# Patient Record
Sex: Female | Born: 1965 | Race: Asian | Hispanic: No | Smoking: Never smoker
Health system: Western US, Academic
[De-identification: ages and names within clinical notes are randomized; demographics above are authoritative.]

## PROBLEM LIST (undated history)

## (undated) DIAGNOSIS — N838 Other noninflammatory disorders of ovary, fallopian tube and broad ligament: Secondary | ICD-10-CM

## (undated) DIAGNOSIS — D649 Anemia, unspecified: Secondary | ICD-10-CM

## (undated) HISTORY — DX: Other noninflammatory disorders of ovary, fallopian tube and broad ligament: N83.8

## (undated) MED ORDER — ACETAMINOPHEN 325 MG PO TABS
650.00 mg | ORAL_TABLET | Freq: Four times a day (QID) | ORAL | 0 refills | Status: AC | PRN
Start: 2021-03-28 — End: ?

## (undated) MED ORDER — LIDOCAINE 5 % EX PTCH
1.00 | MEDICATED_PATCH | CUTANEOUS | 0 refills | Status: AC
Start: 2021-03-28 — End: ?

## (undated) MED ORDER — IBUPROFEN 400 MG OR TABS
400.00 mg | ORAL_TABLET | Freq: Three times a day (TID) | ORAL | 0 refills | Status: AC | PRN
Start: 2021-03-28 — End: ?

---

## 2020-05-03 IMAGING — MR RM COLUNA CERVICAL DORSAL E LOMBAR
7 of 22 series · 13 of 48 positions shown · non-contrast
Comparison: none

[Series 301: T2 · sagittal · 4.0mm · 0.56mm/px · 2 of 16 slices shown (1 of 7)]
[im 1/16]
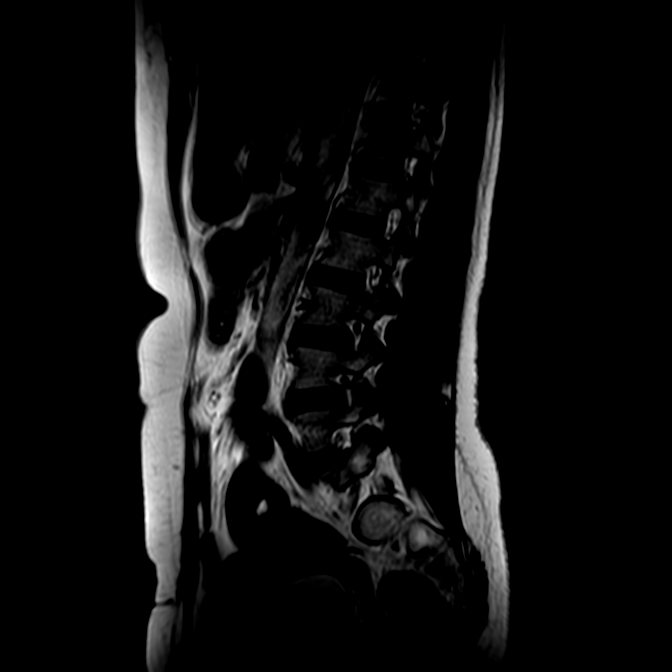
[im 16/16]
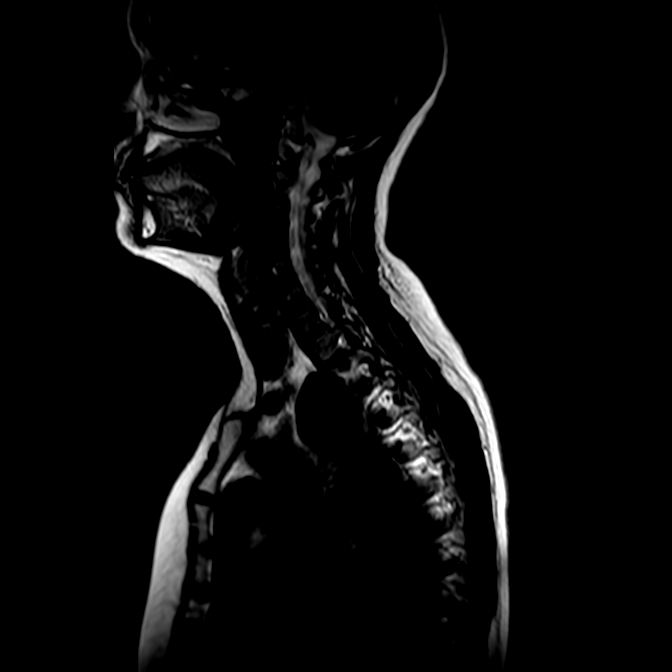

[Series 302: T2 · sagittal · 4.0mm · 0.56mm/px · 2 of 8 slices shown (2 of 7)]
[im 1/8]
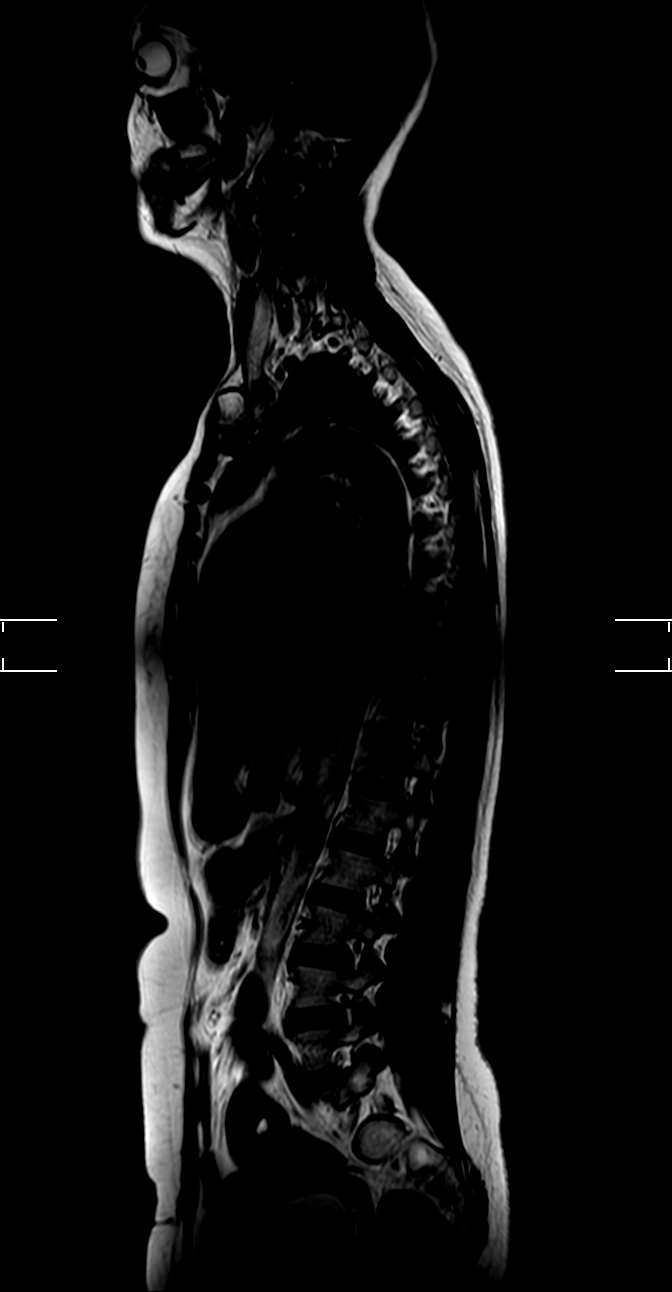
[im 8/8]
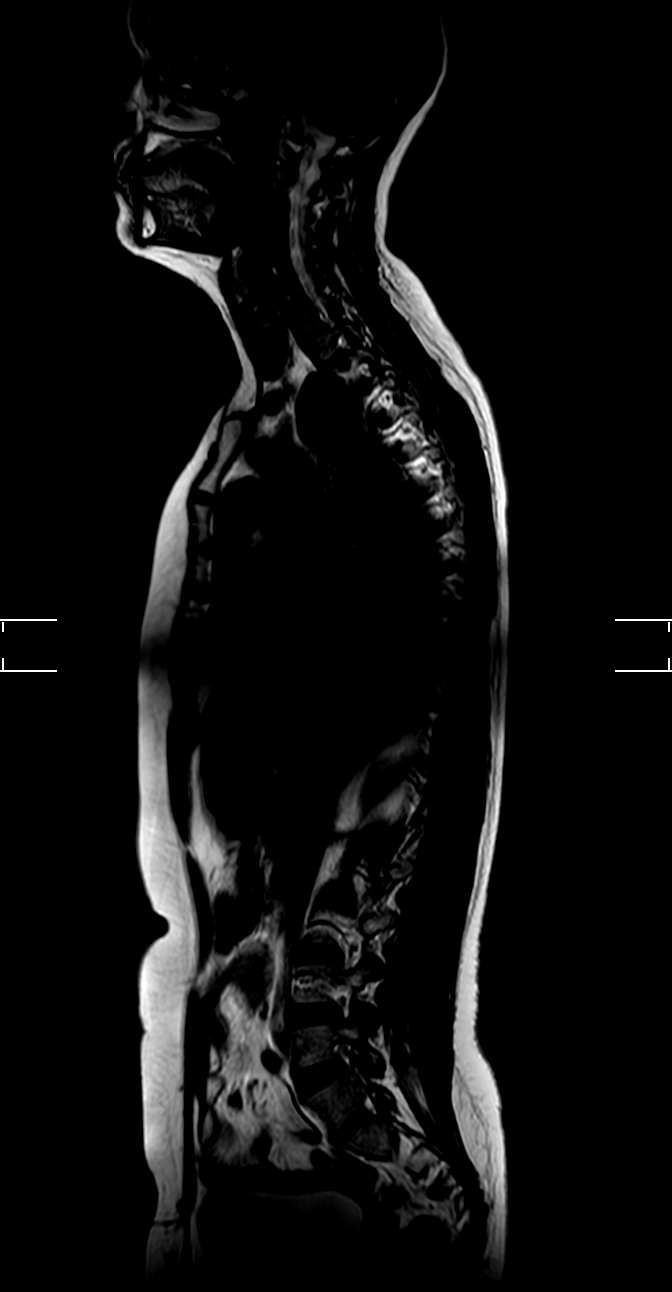

[Series 401: T2 · sagittal · 3.5mm · 0.34mm/px · 2 of 24 slices shown (3 of 7)]
[im 1/24]
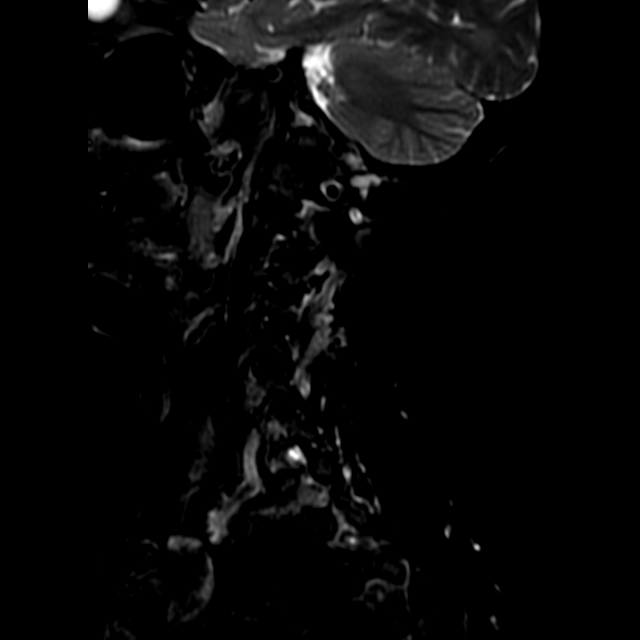
[im 24/24]
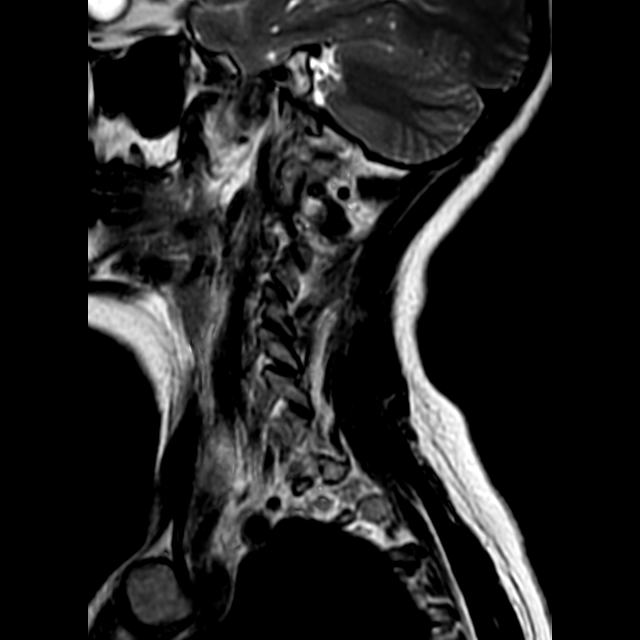

[Series 404: T2 · sagittal · 3.5mm · 0.34mm/px · 1 of 12 slices shown (4 of 7)]
[im 1/12]
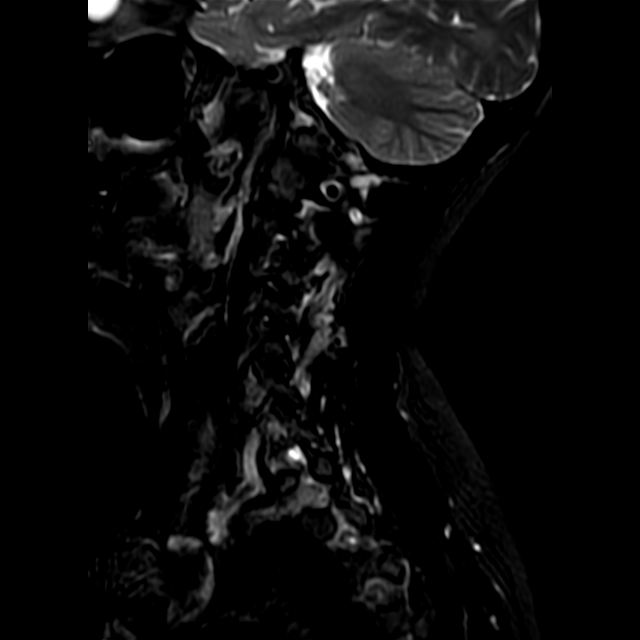

[Series 405: T2 · sagittal · 3.5mm · 0.34mm/px · 1 of 12 slices shown (5 of 7)]
[im 1/12]
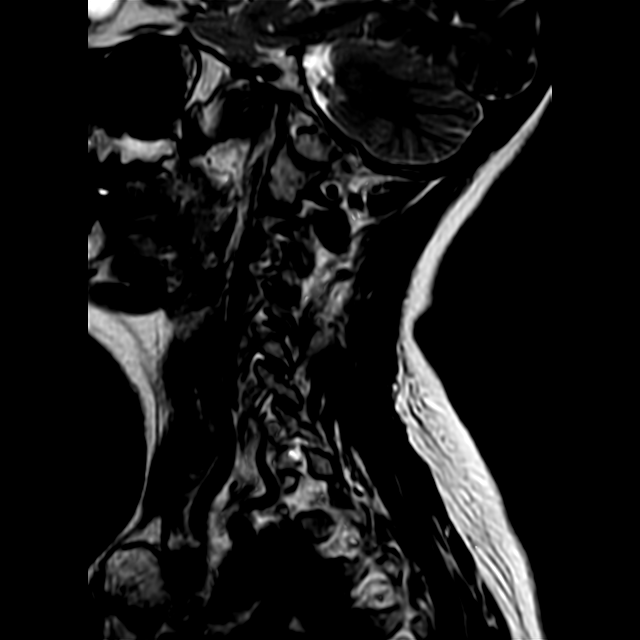

[Series 702: T2 · axial · 4.0mm · 0.30mm/px · z∈[-57,+62]mm · 2 of 25 slices shown (6 of 7)]
[im 1/25]
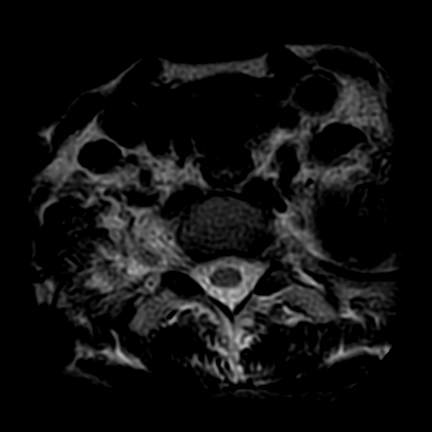
[im 25/25]
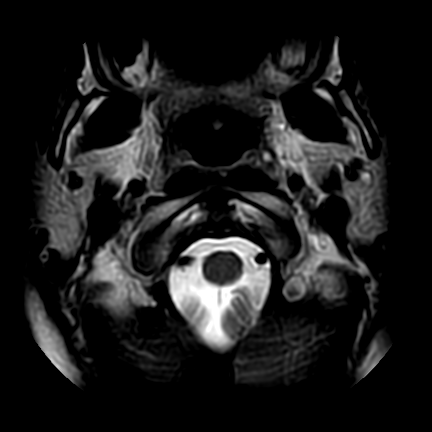

[Series 801: T2 · sagittal · 3.5mm · 0.86mm/px · 3 of 32 slices shown (7 of 7)]
[im 1/32]
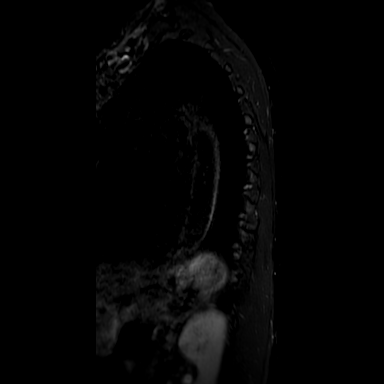
[im 16/32]
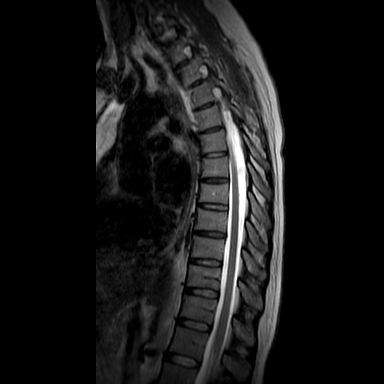
[im 32/32]
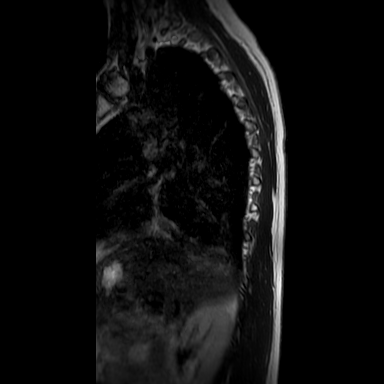

[13 of 48 positions shown; findings below may reference images not displayed]

------------- REPORT [id number] -------------
CERVICAL SPINE MAGNETIC RESONANCE IMAGING
TECHNIQUE
Exam performed on high-field equipment with images obtained in T1, T2, and STIR weighted sequences, without the injection of venous contrast.
FINDINGS
Slight accentuation of physiological cervical lordosis.
Aligned vertebral bodies, with preserved heights, showing anterior marginal osteophytes.
Pedicles without alterations by the method.
Uncovertebral and interapophyseal joints without significant changes.
Dehydration of intervertebral discs, with preserved heights.
Neural foramina and spinal canal with preserved amplitudes.
Spinal cord with normal thickness and signal intensity.
Musculoadipose planes preserved.

------------- REPORT [id number] -------------
MAGNETIC RESONANCE IMAGING OF THE DORSAL SPINE
TECHNIQUE
Exam performed on high-field equipment with image acquisition in T1, T2, and STIR weighted sequences, without the injection of venous contrast.
FINDINGS
Mild accentuation of the physiological dorsal kyphosis.
Aligned vertebral bodies, with preserved heights, showing anterior marginal osteophytes.
Pedicles without alterations to the method.
Costovertebral and interapophyseal joints without significant changes.
Dehydration of the intervertebral discs, with preserved heights.
Neural foramina and spinal canal with preserved amplitudes.
Spinal cord with normal thickness and signal intensity.
Musculoadipose planes preserved.

------------- REPORT [id number] -------------
MAGNETIC RESONANCE IMAGING OF THE LUMBAR SPINE
TECHNIQUE
Exam performed on a high-field equipment with image acquisition in T1, T2, and STIR weighted sequences, without the injection of venous contrast.
FINDINGS
Aligned vertebral bodies, with preserved heights, showing tiny marginal osteophytes.
Pedicles without alterations to the method.
Facet joints without significant changes.
Dehydration of the intervertebral discs, with preserved heights.
Left foraminal disc protrusions at L2-L3 and L3-L4, reducing the amplitude of the respective neuroforamina, maintaining proximity to the emerging nerve root.
Other neural foramina and spinal canal with preserved amplitudes.
Spinal cord with normal thickness and signal intensity.
Musculoadipose planes preserved.

## 2020-05-09 IMAGING — MR RM PE ESQUERDO
7 of 15 series · 18 of 40 positions shown · non-contrast
Comparison: none

[T1 · coronal · 3.0mm · 0.29mm/px · 3 of 48 slices shown (1 of 3)]
[im 1/48]
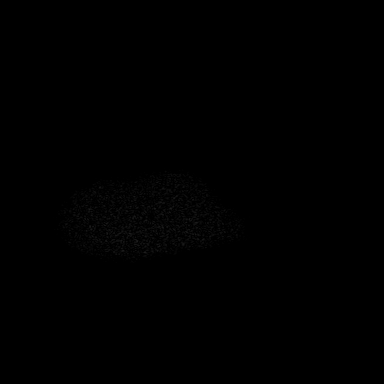
[im 24/48]
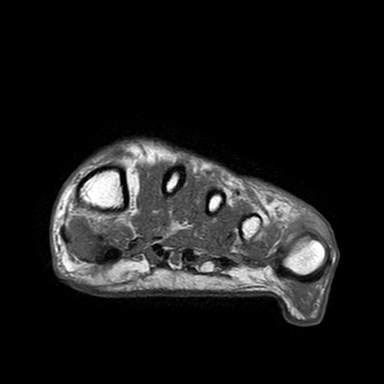
[im 48/48]
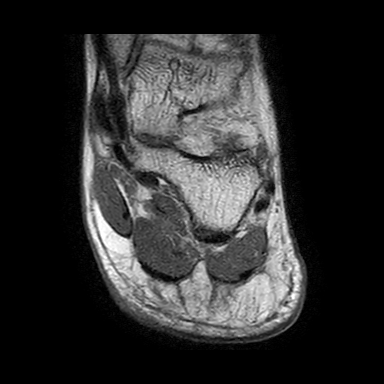

[T1 · coronal · 3.0mm · 0.29mm/px · 3 of 48 slices shown (2 of 3)]
[im 1/48]
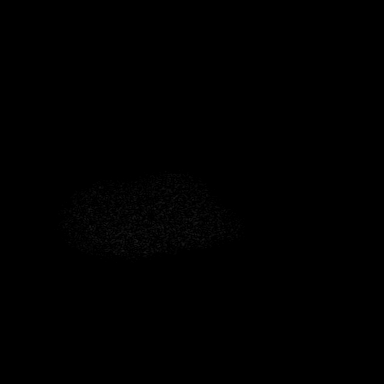
[im 24/48]
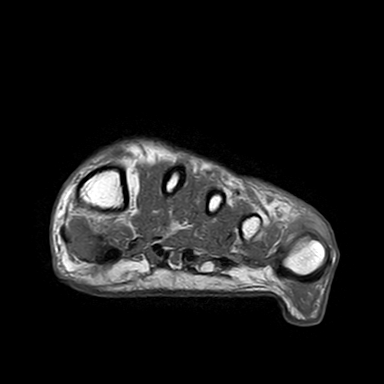
[im 48/48]
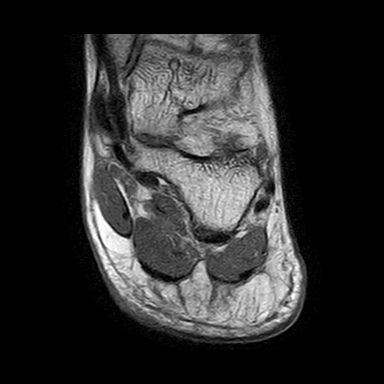

[T1 · sagittal · 3.0mm · 0.23mm/px · 1 of 25 slices shown (3 of 3)]
[im 1/25]
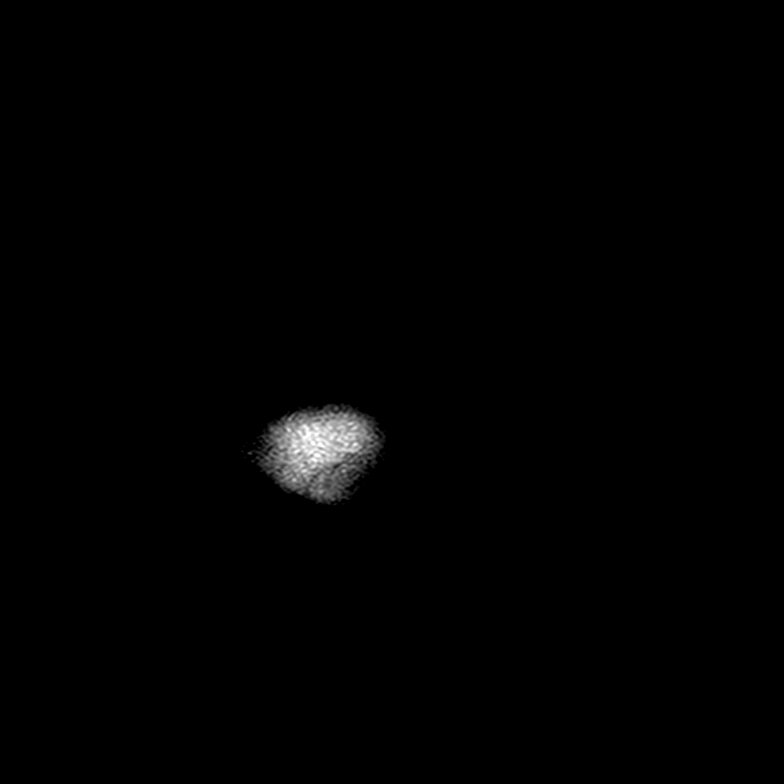

[T1 post-contrast · axial · 3.0mm · 0.24mm/px · z∈[-55,+25]mm · 3 of 48 slices shown (1 of 2)]
[im 1/48]
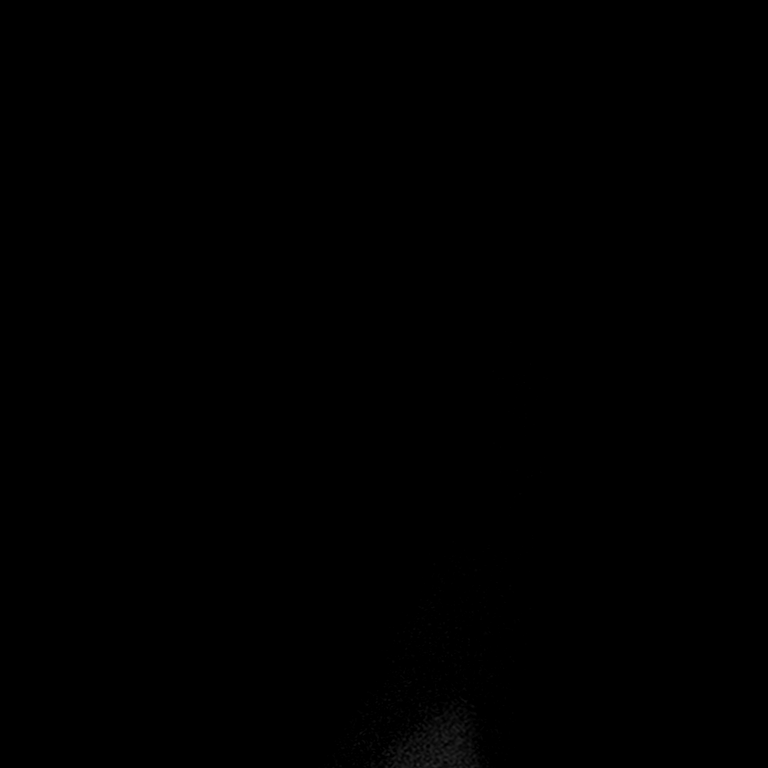
[im 24/48]
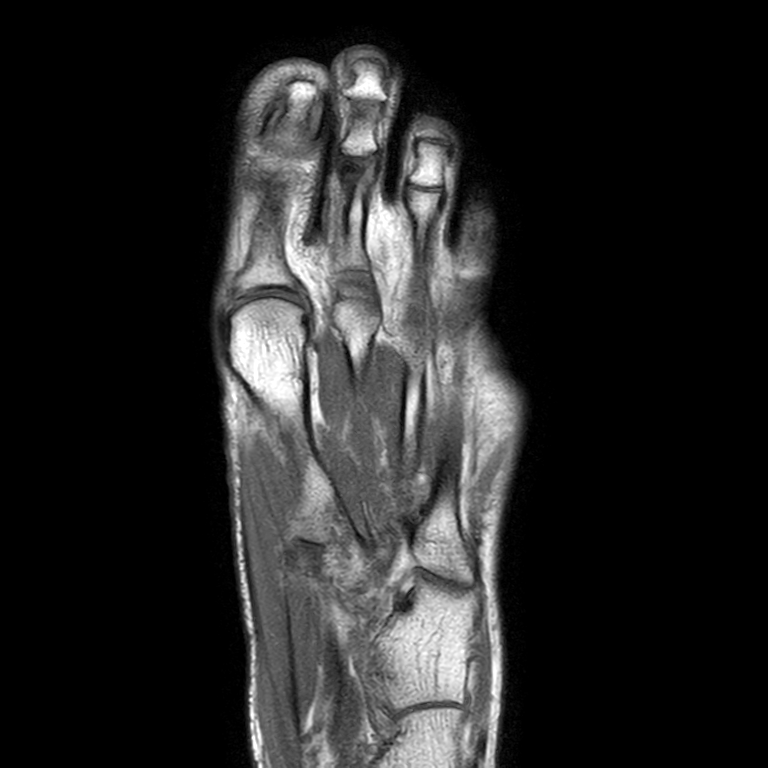
[im 48/48]
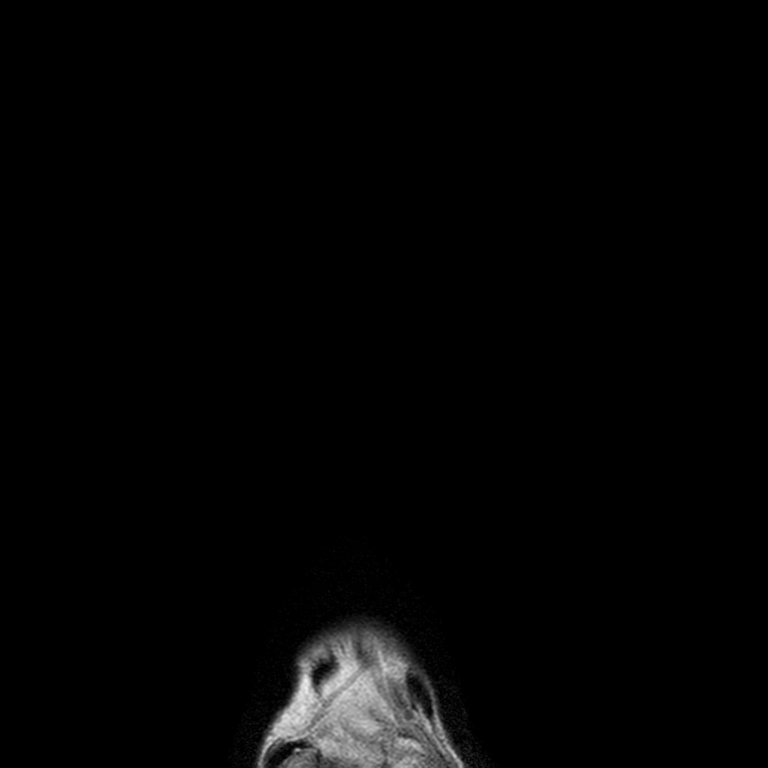

[T1 fat-sat post-contrast · axial · 3.0mm · 0.24mm/px · 1 of 24 slices shown (1 of 2)]
[im 1/24]
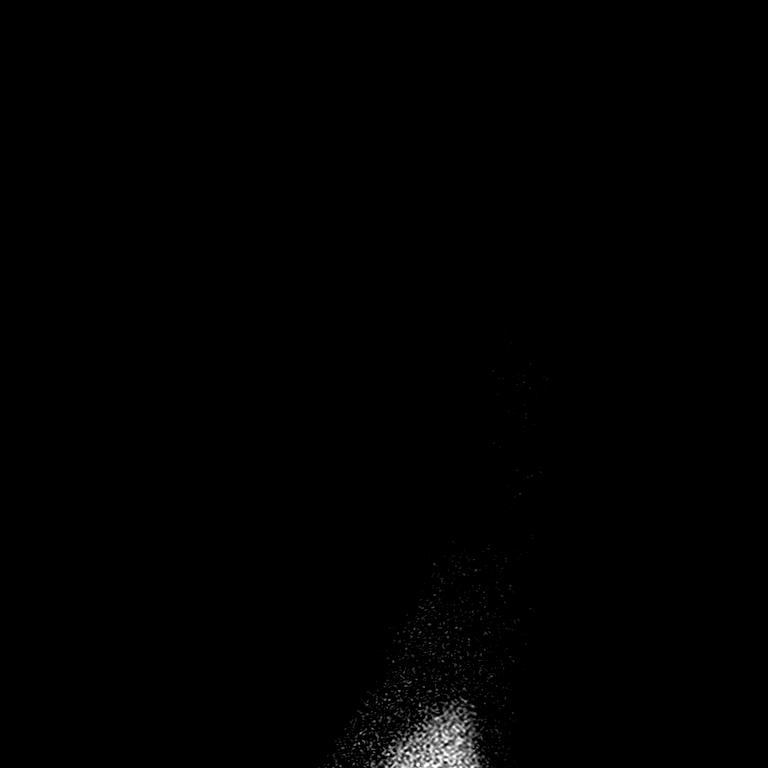

[T1 post-contrast · coronal · 3.0mm · 0.26mm/px · 5 of 96 slices shown (2 of 2)]
[im 1/96]
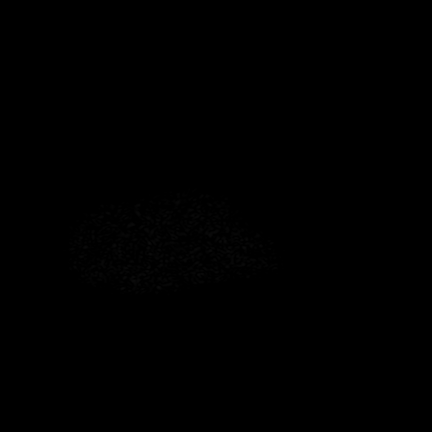
[im 24/96]
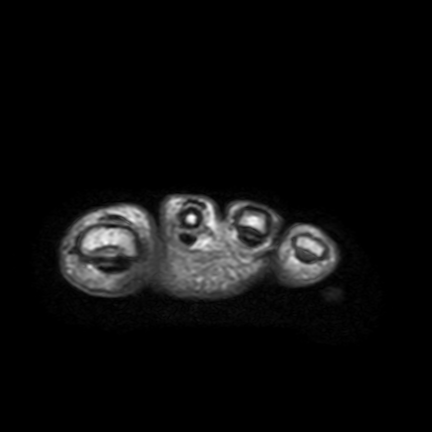
[im 48/96]
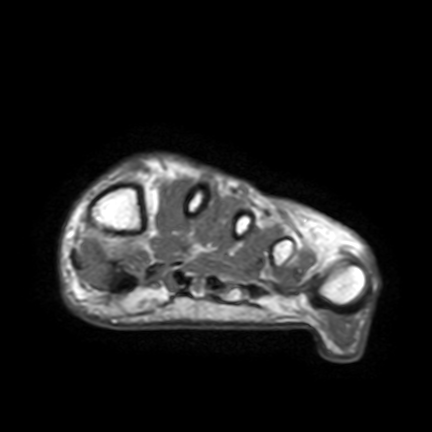
[im 72/96]
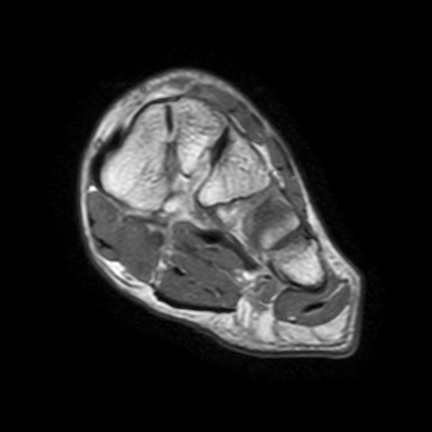
[im 96/96]
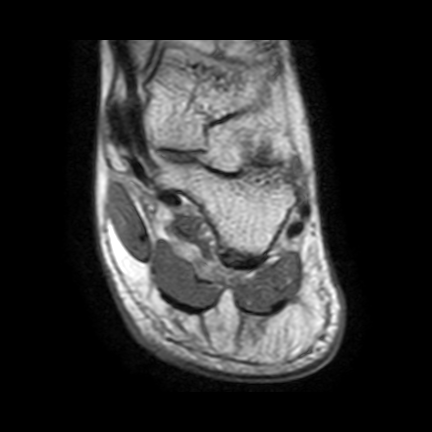

[T1 fat-sat post-contrast · coronal · 3.0mm · 0.26mm/px · 2 of 48 slices shown (2 of 2)]
[im 1/48]
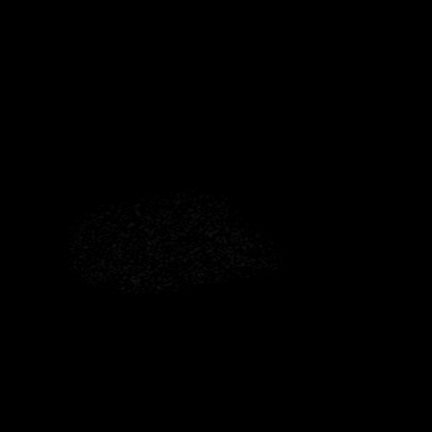
[im 24/48]
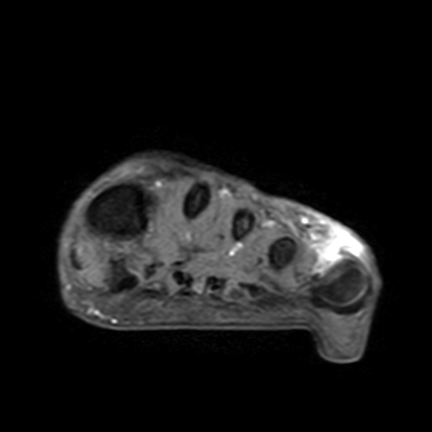

[18 of 40 positions shown; findings below may reference images not displayed]

TÉCNICA DE EXAME:
Realizadas sequências multiplanares ponderadas em T1 e densidade protônica com supressão de gordura, 
antes e após a administração do contraste venoso.
ANÁLISE:
Obliteração / edema do coxim gorduroso plantar adjacente à falange proximal / cabeça do metatarsal 
RESSONÂNCIA MAGNÉTICA DO PÉ DIREITO/ESQUERDO
do  quinto  dedo,  inferindo  sobrecarga  mecânica,  devendo-se  considerar  no  diagnóstico  diferencial  a 
possibilidade de bursite adventícia / subcapital.
Rotura do complexo capsuloligamentar medial da metatarsofalângica do quinto dedo , notando-se edema das 
partes moles circunjacentes , bem como realce após a administração do meio de contraste paramagnético.
Estruturas ósseas com morfologia e intensidade de sinal preservados.
Não há sinais de derrame articular significativo.
Ligamento de Lisfranc íntegro.
Não há sinais de desinserção das placas plantares.
Estruturas musculotendíneas com morfologia e intensidade de sinal normais.
Formação  nodular  na  face  plantar  entre  as  cabeças  do  terceiro  e  quarto  metatarsos,  medindo  0,7  cm, 
compatível com neuroma interdigital (Morton). Associam-se sinais de bursite intermetatársica neste espaço.

## 2020-05-19 IMAGING — MR RM PE DIREITO
6 of 15 series · 16 of 40 positions shown · non-contrast
Comparison: none

[Series 501: T1 · coronal · 3.5mm · 0.29mm/px · 3 of 40 slices shown (1 of 3)]
[im 1/40]
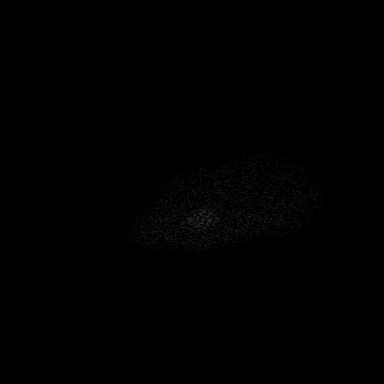
[im 20/40]
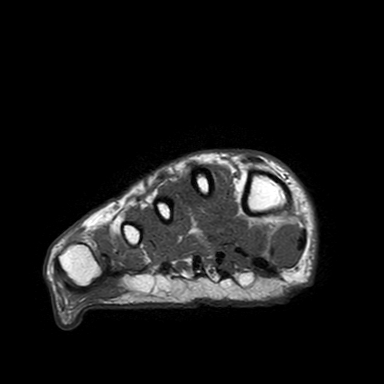
[im 40/40]
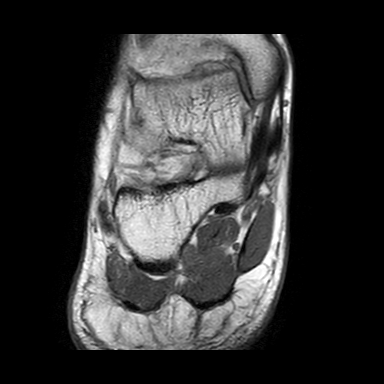

[Series 502: T1 · coronal · 3.5mm · 0.29mm/px · 3 of 40 slices shown (2 of 3)]
[im 1/40]
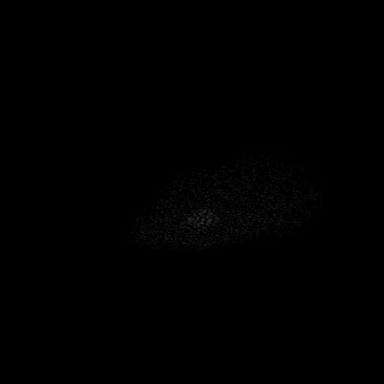
[im 20/40]
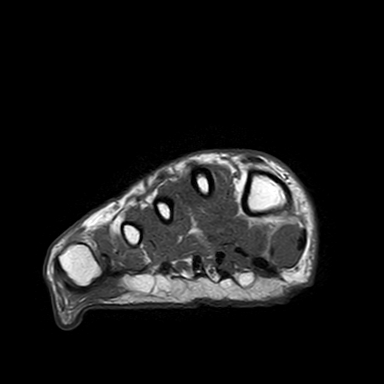
[im 40/40]
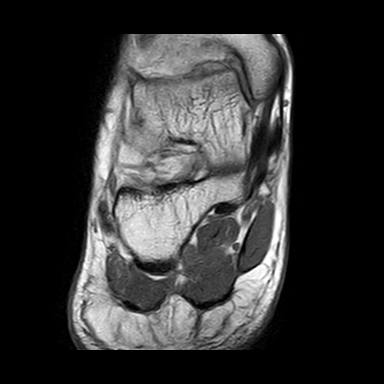

[Series 701: T1 · sagittal · 3.0mm · 0.23mm/px · 2 of 25 slices shown (3 of 3)]
[im 1/25]
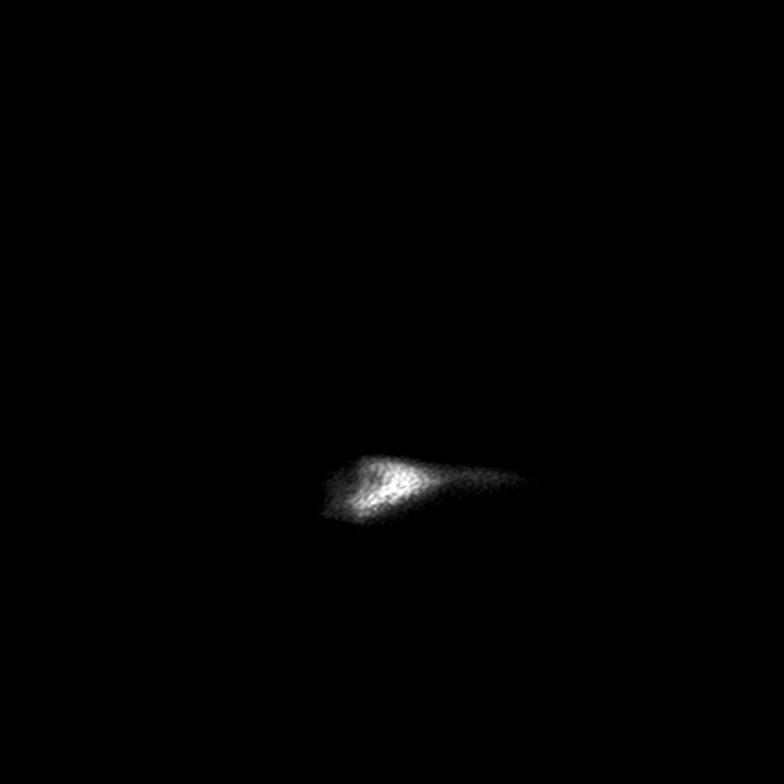
[im 25/25]
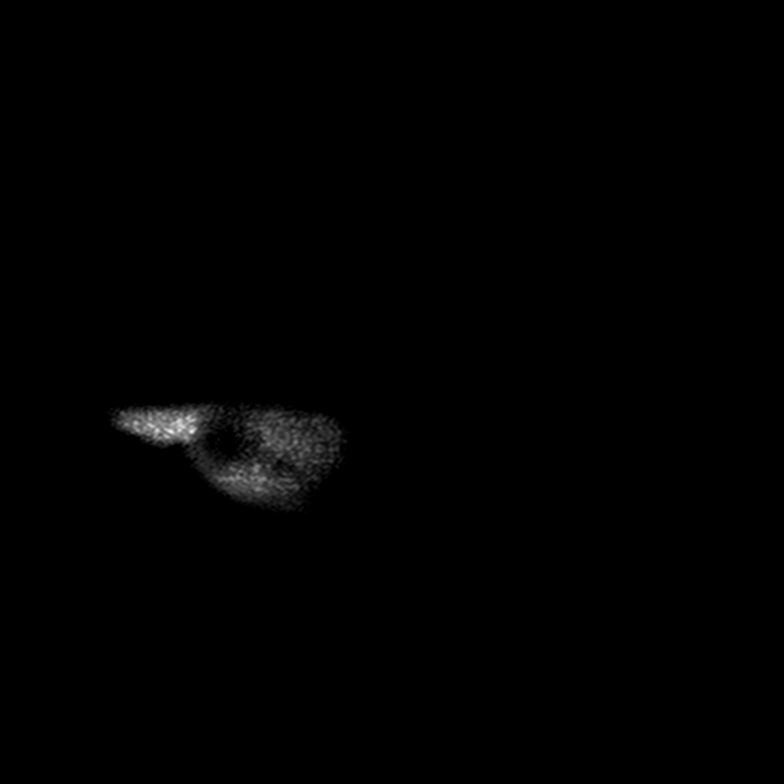

[Series 801: T1 post-contrast · axial · 3.0mm · 0.24mm/px · z∈[-67,-1]mm · 3 of 40 slices shown (1 of 2)]
[im 1/40]
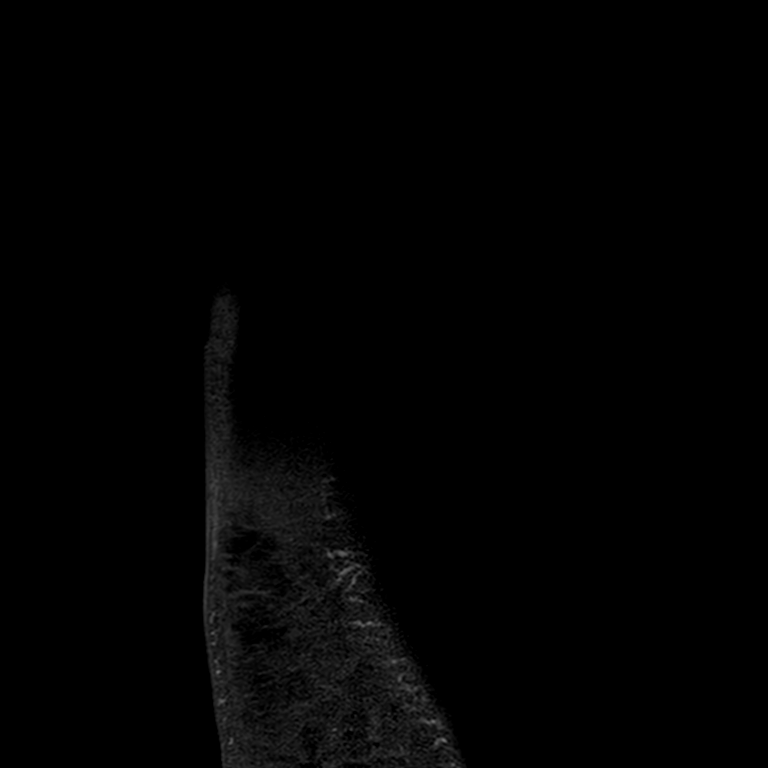
[im 20/40]
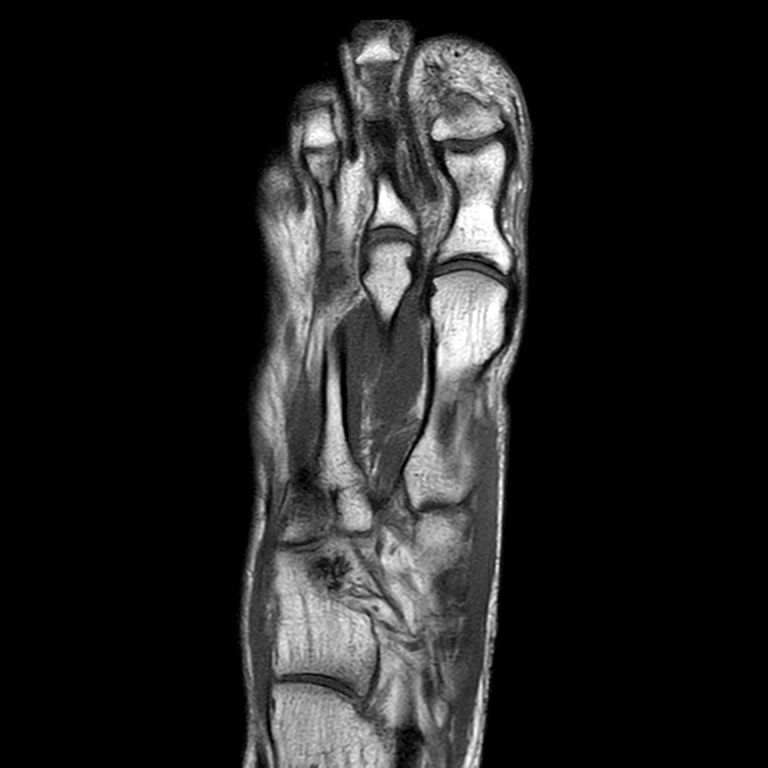
[im 40/40]
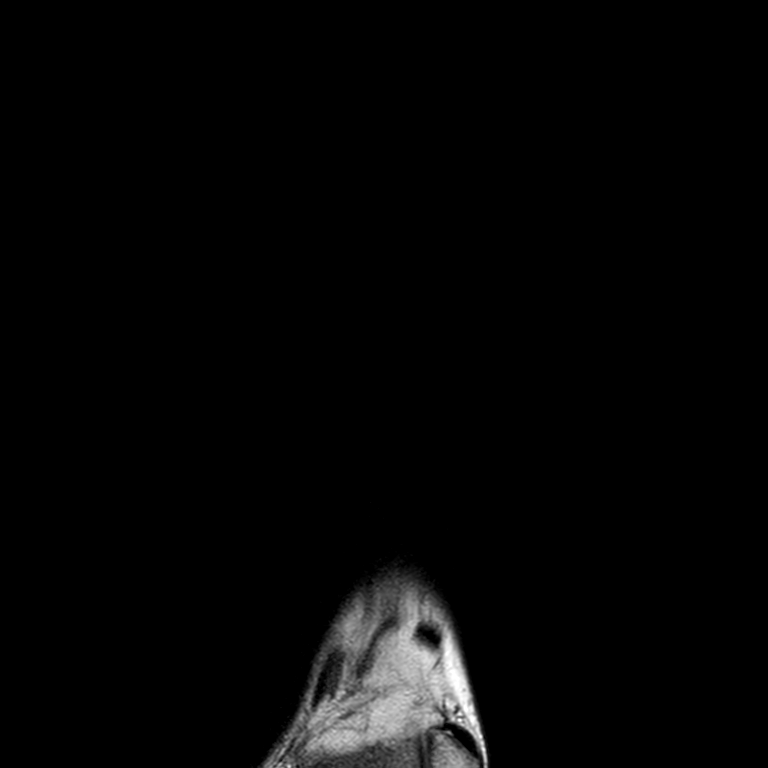

[Series 802: T1 fat-sat post-contrast · axial · 3.0mm · 0.24mm/px · 1 of 20 slices shown]
[im 1/20]
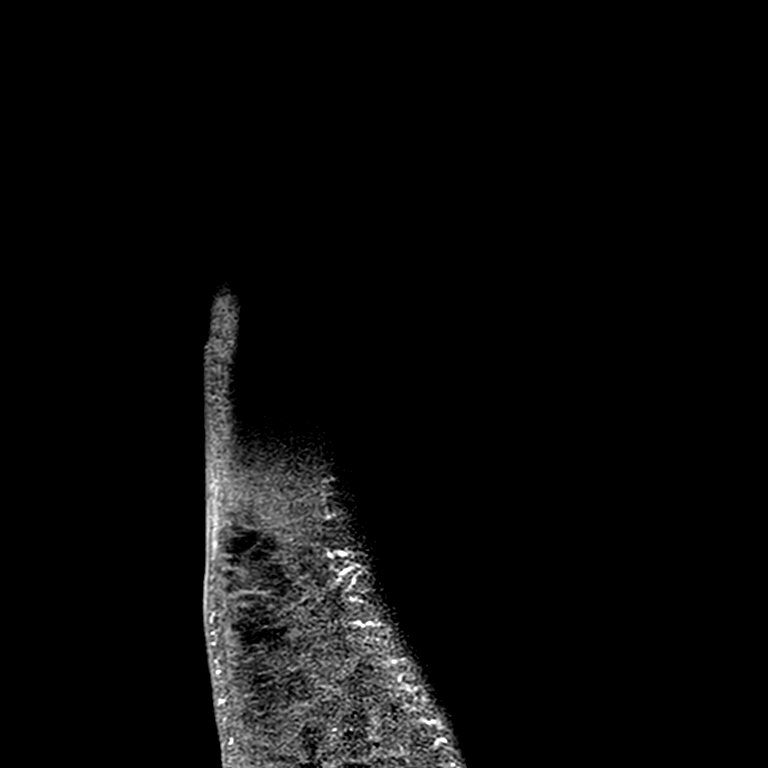

[Series 901: T1 post-contrast · coronal · 3.5mm · 0.26mm/px · 4 of 80 slices shown (2 of 2)]
[im 1/80]
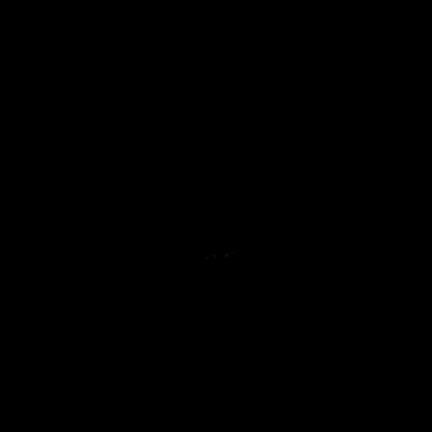
[im 20/80]
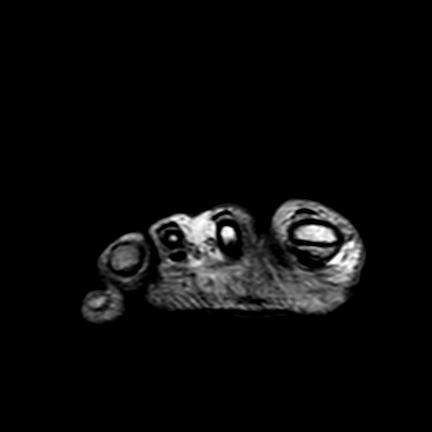
[im 40/80]
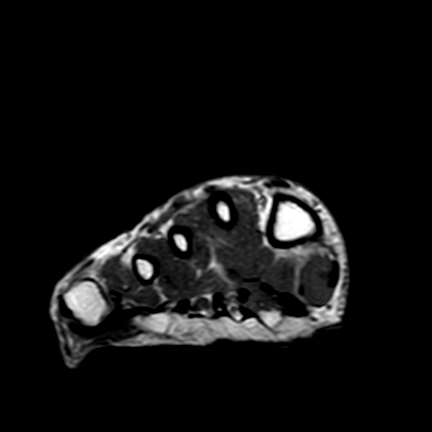
[im 60/80]
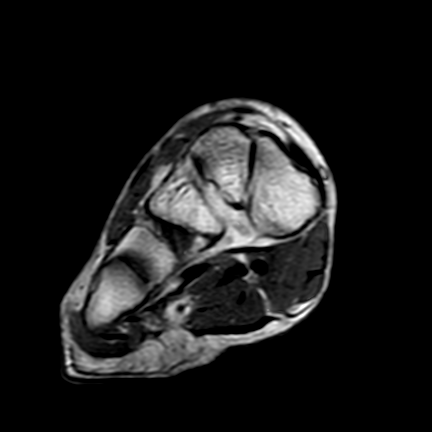

[16 of 40 positions shown; findings below may reference images not displayed]

RESSONÂNCIA MAGNÉTICA DO ANTEPÉ DIREITO
TÉCNICA
Exame realizado com sequências multiplanares pesadas em T1, T2 e DP, algumas com supressão de
gordura, com administração de contraste.
ACHADOS
Pequena fratura avulsiva na base do V metatarso, sem afastamento significativo do fragmento ósseo em
relação ao leito ósseo, notando-se intenso edema medular ósseo local, associado a espessamento e
alteração de sinal da inserção do tendão fibular curto, indicando tendinopatia, com edema reacional das
partes moles adjacentes.
Demais estruturas ósseas preservadas.
Superfícies condrais íntegras e regulares.
Estruturas capsuloligamentares preservadas.
Ausência de derrame articular significativo.
Ausência de neuromas e de bursopatias.
Demais planos miotendíneos, fasciais, adiposos e ligamentares e feixes neurovasculares sem alterações
significativas identificáveis ao método.
IMPRESSÃO
Pequena fratura avulsiva na base do V metatarso,  associado a espessamento e alteração de sinal da
inserção do tendão fibular curto, indicando tendinopatia, com edema reacional das partes moles
adjacentes.

## 2021-02-06 IMAGING — MR RM COL. CERVICAL DORSAL E LOMBAR
12 of 30 series · 16 of 48 positions shown · non-contrast
Comparison: none

------------- REPORT [id number] -------------
CERVICAL SPINE MAGNETIC RESONANCE IMAGING
TECHNIQUE: Examination performed using spin-echo technique with multiplanar acquisitions, obtaining T1 and T2 weighted sequences in multiplanar acquisitions.  
Analysis:  
Craniocervical transition with anatomical appearance.  
Normal alignment of vertebral bodies.  
Vertebral bodies with preserved height.  
Marginal osteophytes on vertebral bodies.  
Interfacet joints without significant alterations.  
Diffuse disc dehydration without significant reduction in their heights.  
No significant disc bulges or protrusions observed at the evaluated levels.  
Preserved amplitude of the spinal canal.  
Spinal cord with usual caliber and signal intensity.  
Preserved posterior paravertebral musculature.  
Diagnostic impression:  
Incipient degenerative spondylopathy.

[Series 301: T2 · sagittal · 3.5mm · 0.34mm/px · 1 of 24 slices shown (1 of 12)]
[im 1/24]
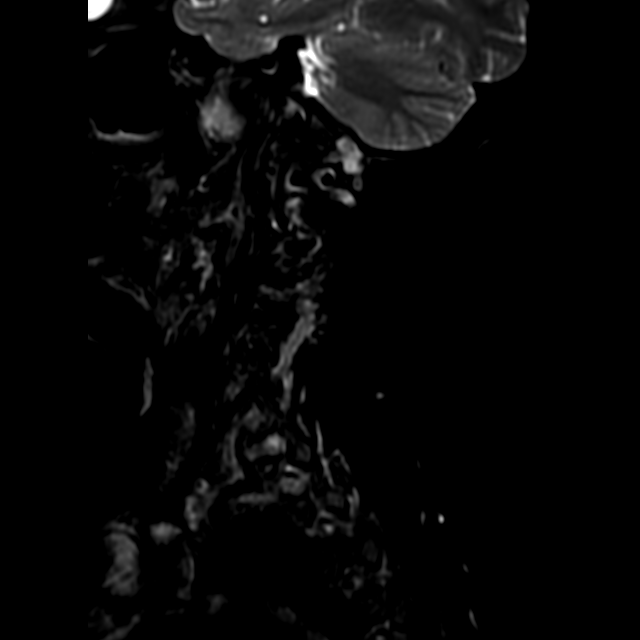

[Series 302: T2 · sagittal · 3.5mm · 0.34mm/px · 1 of 12 slices shown (2 of 12)]
[im 1/12]
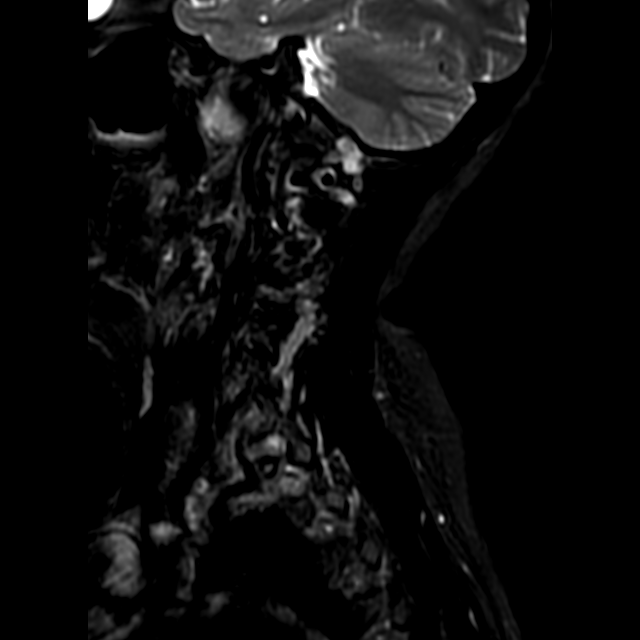

[Series 303: T2 · sagittal · 3.5mm · 0.34mm/px · 1 of 12 slices shown (3 of 12)]
[im 1/12]
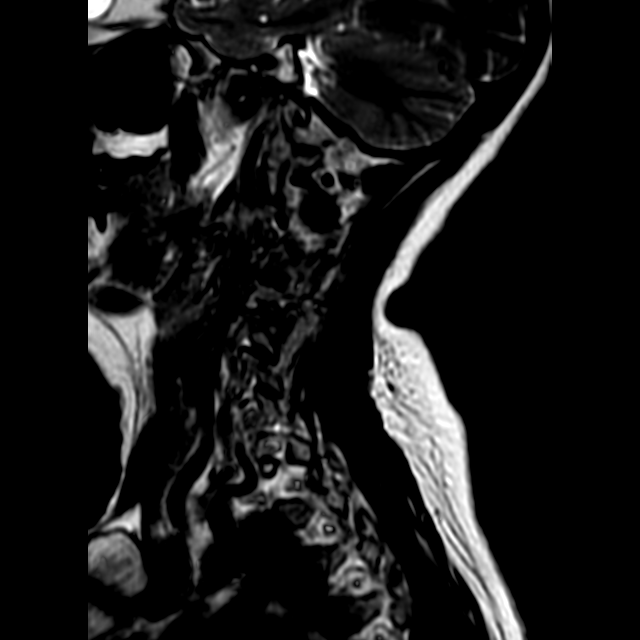

[Series 304: T2 · sagittal · 3.5mm · 0.34mm/px · 1 of 12 slices shown (4 of 12)]
[im 1/12]
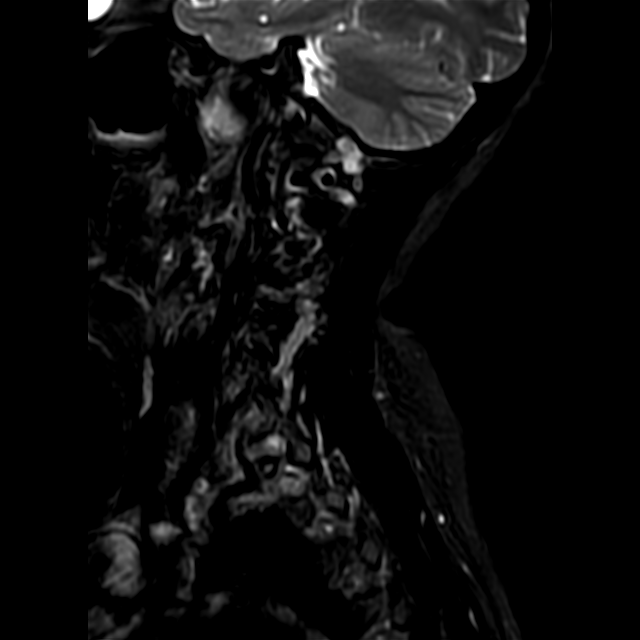

[Series 305: T2 · sagittal · 3.5mm · 0.34mm/px · 1 of 12 slices shown (5 of 12)]
[im 1/12]
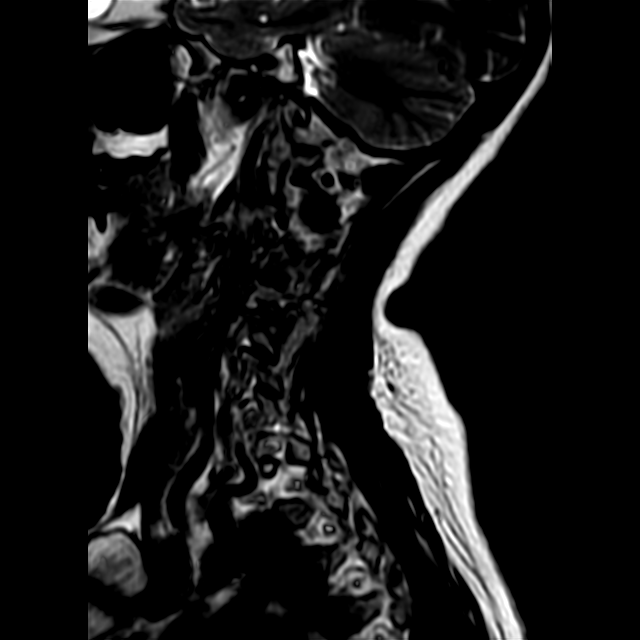

[Series 401: T2 · sagittal · 4.0mm · 0.54mm/px · 1 of 12 slices shown (6 of 12)]
[im 1/12]
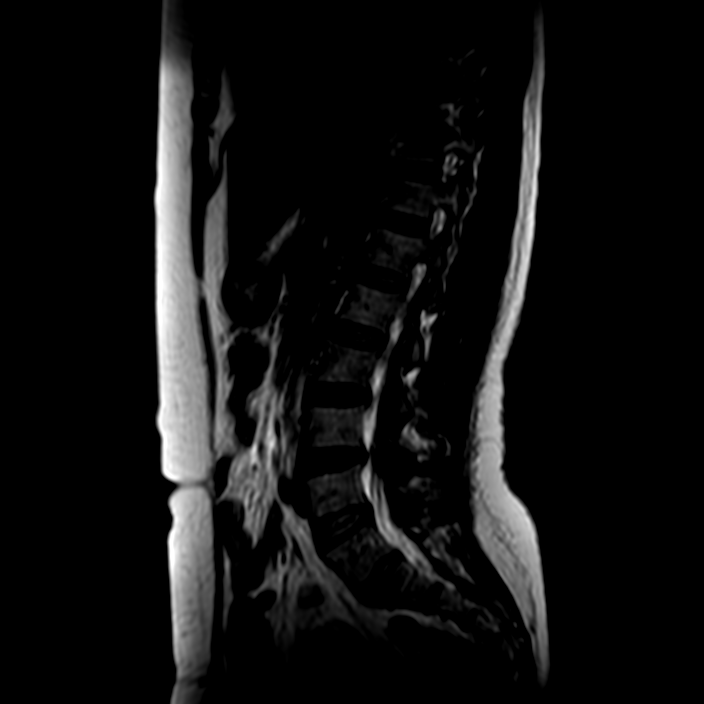

[Series 402: T2 · sagittal · 4.0mm · 0.54mm/px · 1 of 6 slices shown (7 of 12)]
[im 1/6]
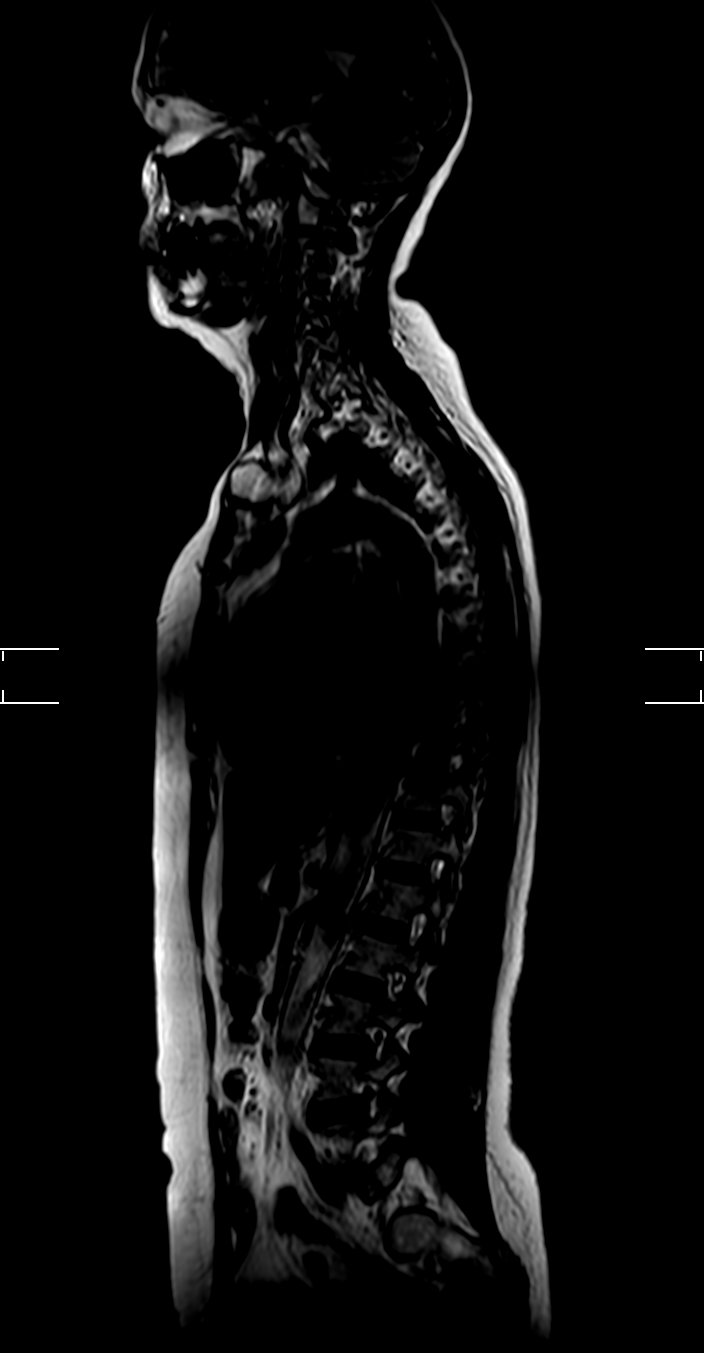

[Series 601: T2 · axial · 3.5mm · 0.30mm/px · 1 of 30 slices shown (8 of 12)]
[im 1/30]
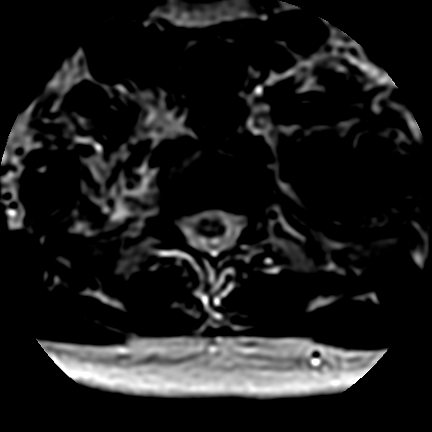

[Series 801: T2 · sagittal · 3.5mm · 0.85mm/px · 2 of 32 slices shown (9 of 12)]
[im 1/32]
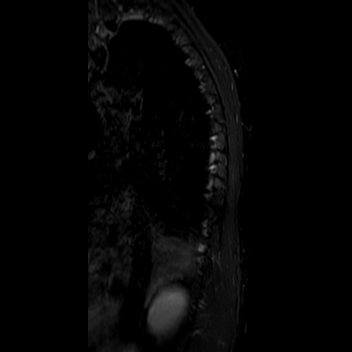
[im 32/32]
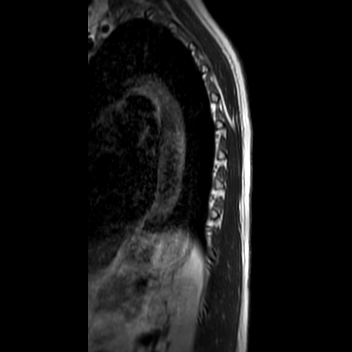

[Series 802: T2 · sagittal · 3.5mm · 0.85mm/px · 1 of 16 slices shown (10 of 12)]
[im 1/16]
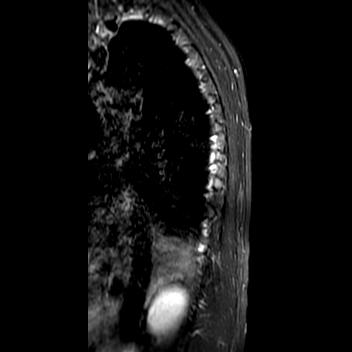

[Series 803: T2 · sagittal · 3.5mm · 0.85mm/px · 1 of 16 slices shown (11 of 12)]
[im 1/16]
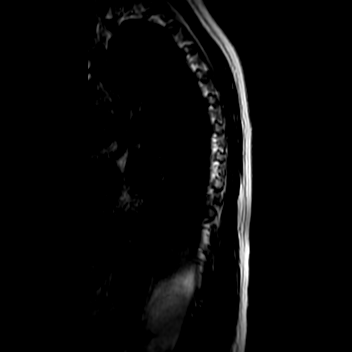

[Series 1001: T2 · axial · 4.0mm · 0.34mm/px · z∈[-305,-42]mm · 4 of 63 slices shown (12 of 12)]
[im 1/63]
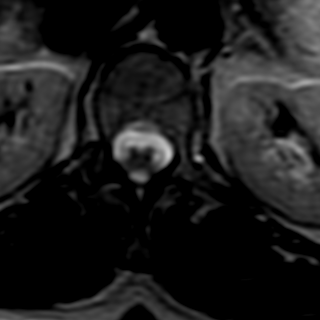
[im 21/63]
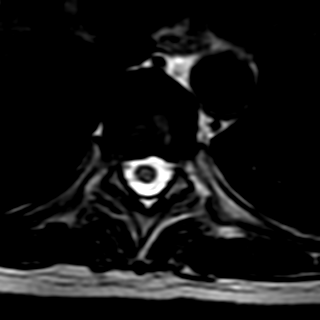
[im 42/63]
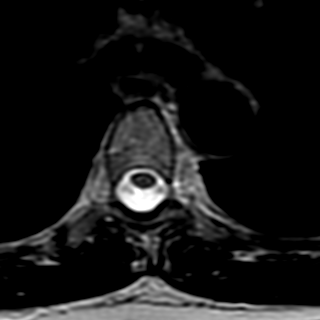
[im 63/63]
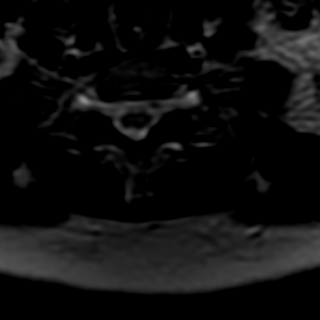

[16 of 48 positions shown; findings below may reference images not displayed]

------------- REPORT [id number] -------------
THORACIC SPINE MAGNETIC RESONANCE IMAGING
METHODOLOGY
T1 and T2 weighted sequences were obtained in multiplanar acquisitions.
ANALYSIS
Vertebral bodies aligned in the sagittal plane, with preserved height and bone marrow signal, without signs of fractures.
Intact pedicles and posterior arch structures.
Intervertebral discs with usual height and hydration, without significant bulges or protrusions.
Regular contours of the facet joints.
Normal diameters of the vertebral canal and neural foramina.
Spinal cord with preserved shape and signal.
Paravertebral musculature without alterations.
OPINION
Examination within normal standards.

------------- REPORT [id number] -------------
LUMBAR SPINE MAGNETIC RESONANCE IMAGING

Method:
Exam performed using the spin-echo technique with multiplanar acquisitions.
Analysis:
Vertebra with characteristics of lumbosacral transition, denominated VT. If there is clinical-surgical interest, evaluation through a total spine study can provide the exact count of vertebral bodies.
Normal alignment of vertebral bodies.
Preserved height of vertebral bodies.
Interfacet joints without significant alterations.
Discal dehydration at L3-L4, without significant reduction of intervertebral spaces.
Absence of significant disc herniations.
Medullary cone with preserved morphology, caliber, and signal.
Vertebral canal without significant stenoses.
Cauda equina roots with preserved morphology and distribution.
Preserved posterior paravertebral musculature.
Conclusion
Incipient and non-protruding degenerative discopathy at L3-L4.

## 2021-03-07 IMAGING — CR Imaging study
3 series · 3 of 3 positions shown · non-contrast
Comparison: none

[view not recorded (1 of 3)]
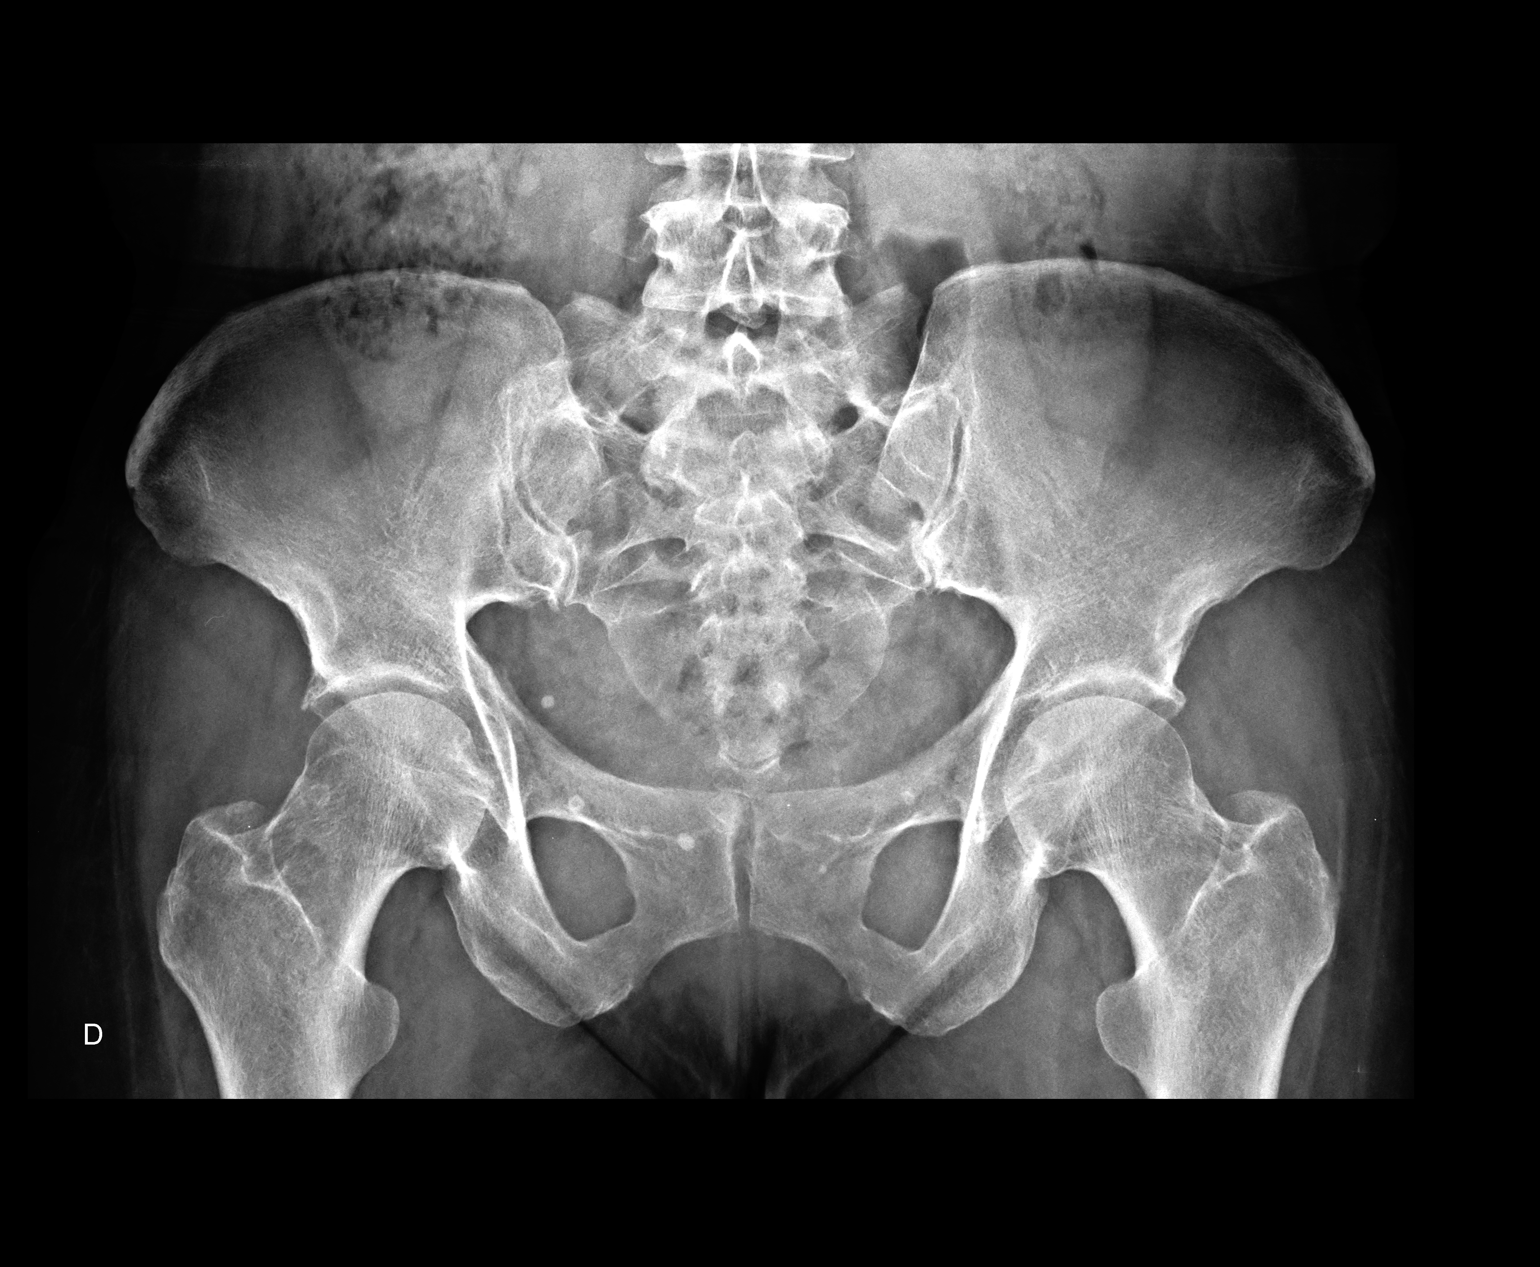

[view not recorded (2 of 3)]
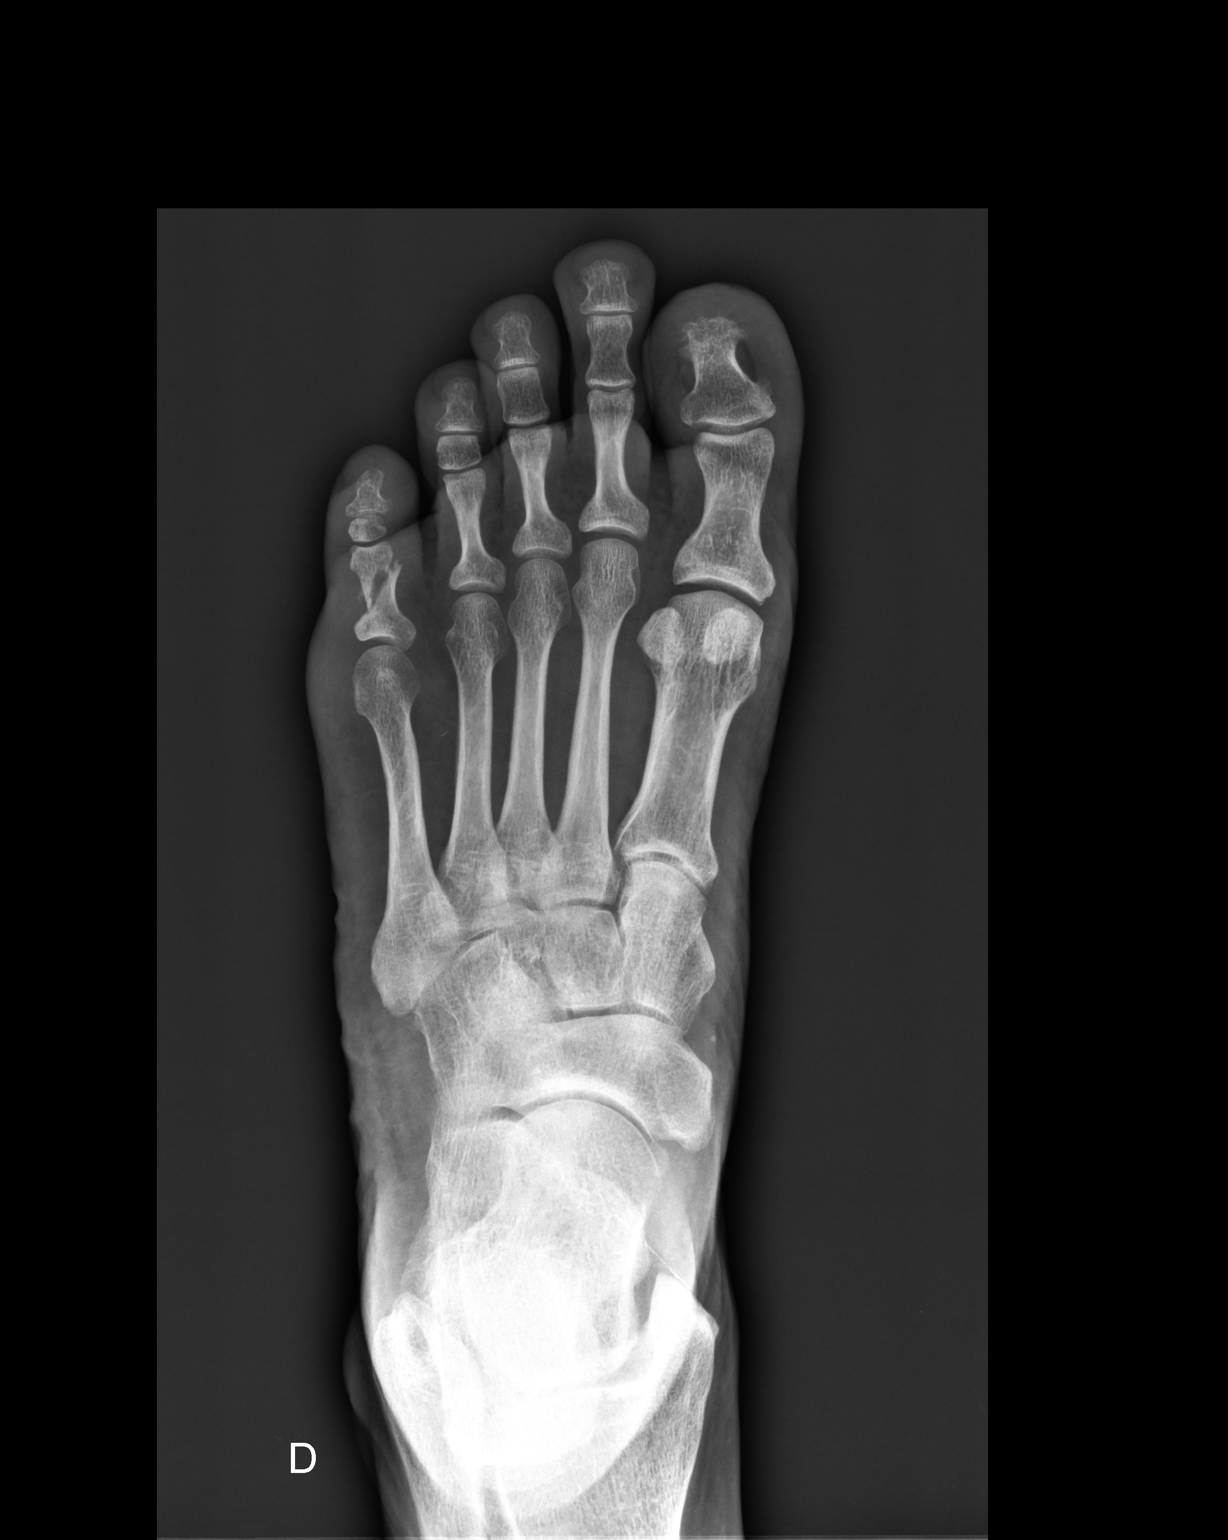

[view not recorded (3 of 3)]
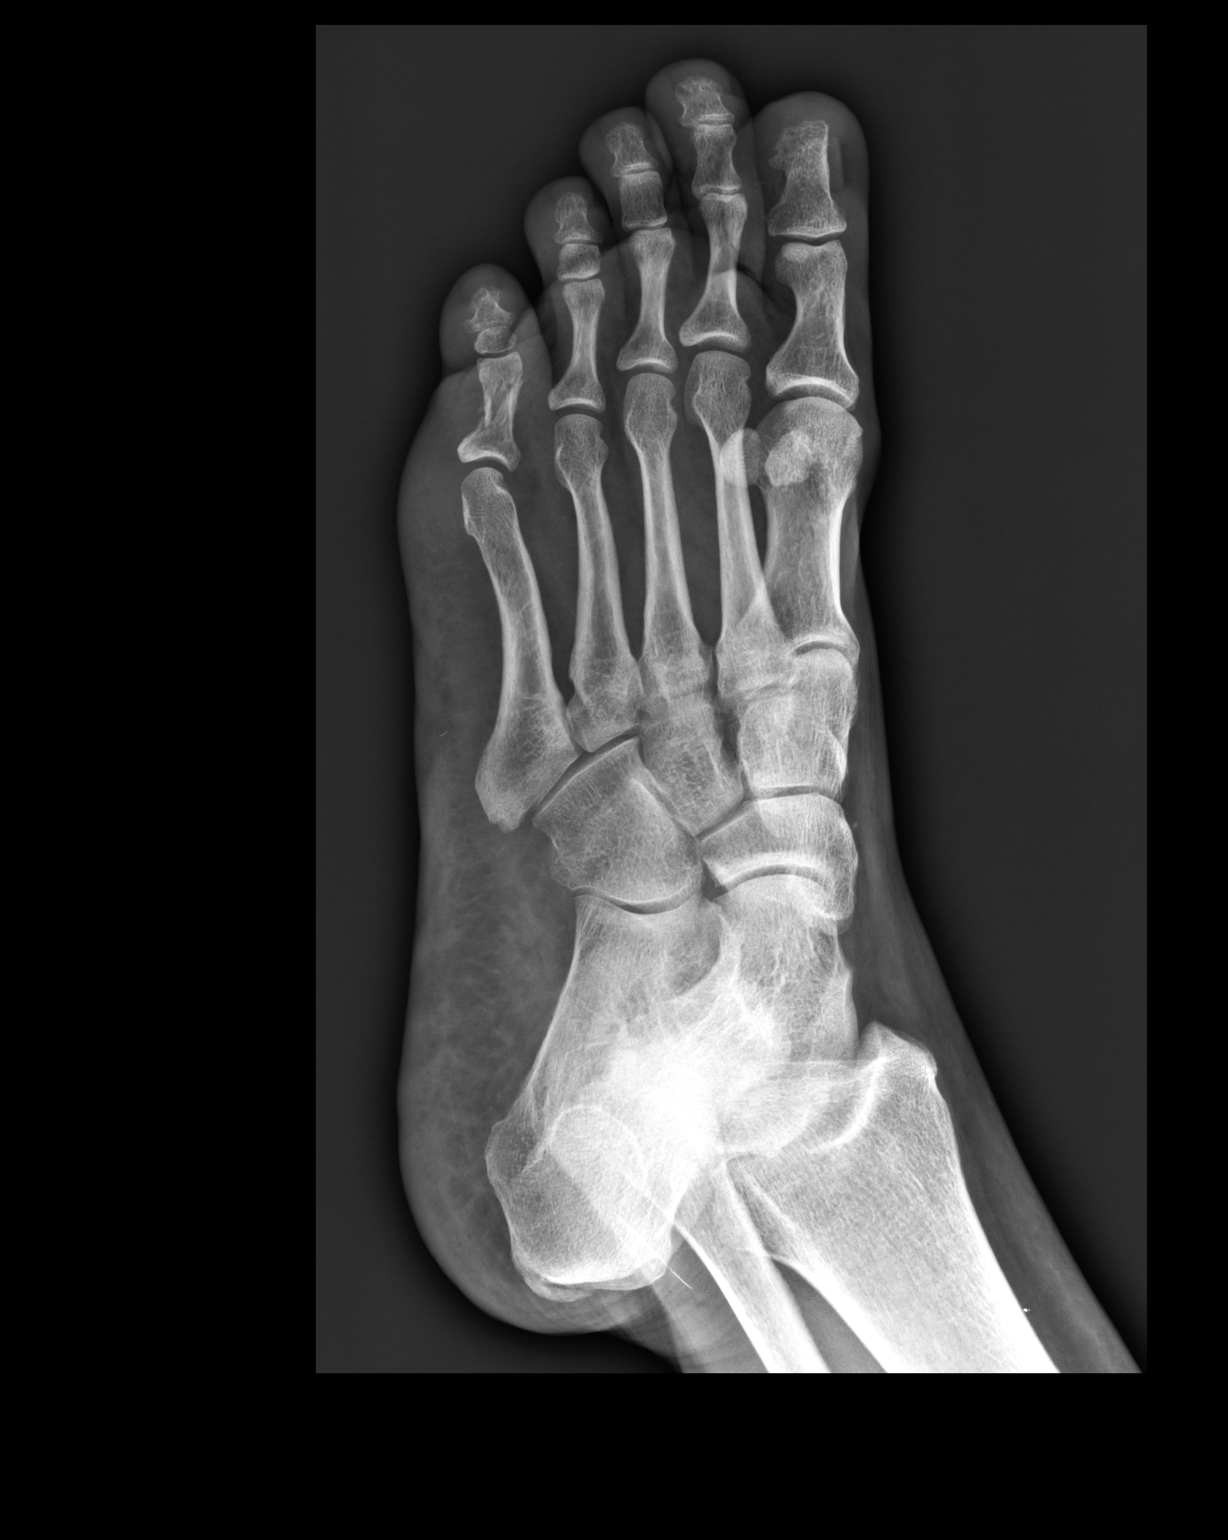

[3 of 3 positions shown; findings below may reference images not displayed]

------------- REPORT [id number] -------------
PELVIC X-RAY
Intact bone structure, no evidence of misaligned fractures.
Joint spaces and relationships maintained.
Tiny pelvic phleboliths.
No significant soft tissue changes noted on the exam.

------------- REPORT [id number] -------------
X-RAY OF THE RIGHT FOOT
Slightly misaligned fracture line in the proximal phalanx of the 5th toe.
Remaining bone structure intact, with no evidence of misaligned fractures.
Joint spaces and relationships maintained.
Soft tissues show no significant changes on the imaging.

## 2021-03-13 IMAGING — MR RM PELVE
15 of 21 series · 29 of 48 positions shown · non-contrast
Comparison: none

[survey ssh · axial · 7.0mm · 0.94mm/px · z∈[-60,+231]mm · 2 of 22 slices shown]
[im 1/22]
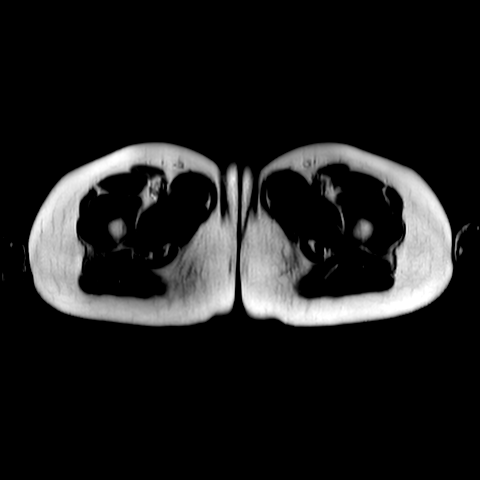
[im 22/22]
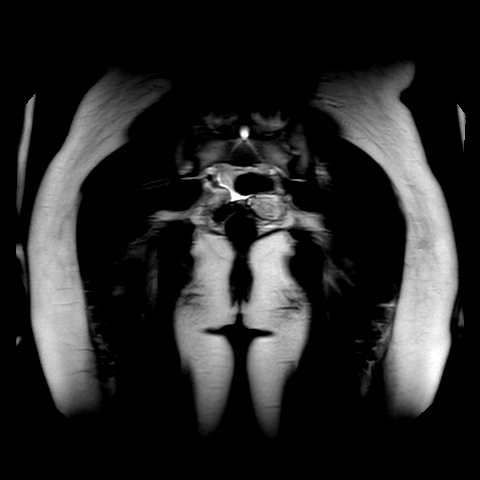

[T2 · coronal · 4.0mm · 0.31mm/px · 1 of 36 slices shown (1 of 5)]
[im 1/36]
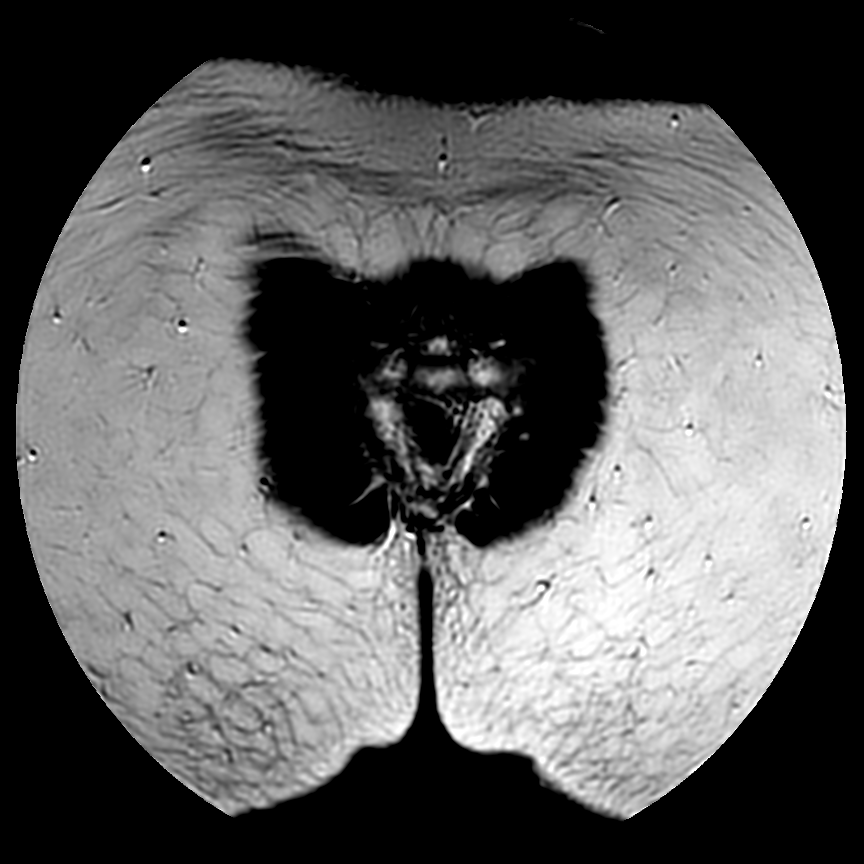

[T2 · coronal · 4.0mm · 0.31mm/px · 1 of 36 slices shown (2 of 5)]
[im 1/36]
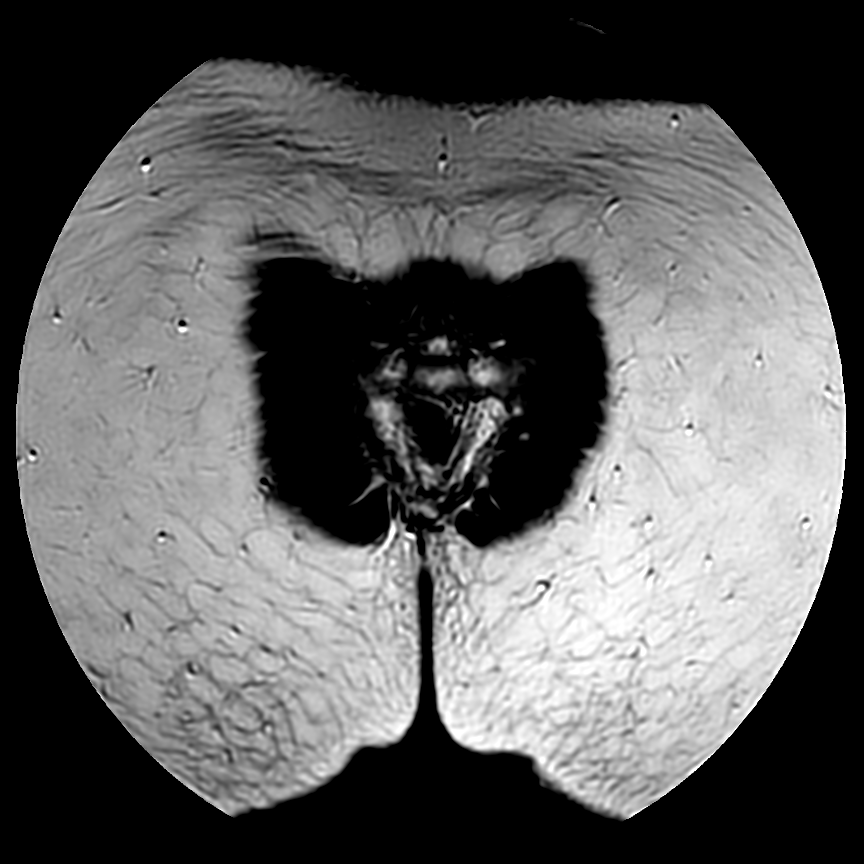

[T2 · sagittal · 4.0mm · 0.31mm/px · 1 of 30 slices shown (3 of 5)]
[im 1/30]
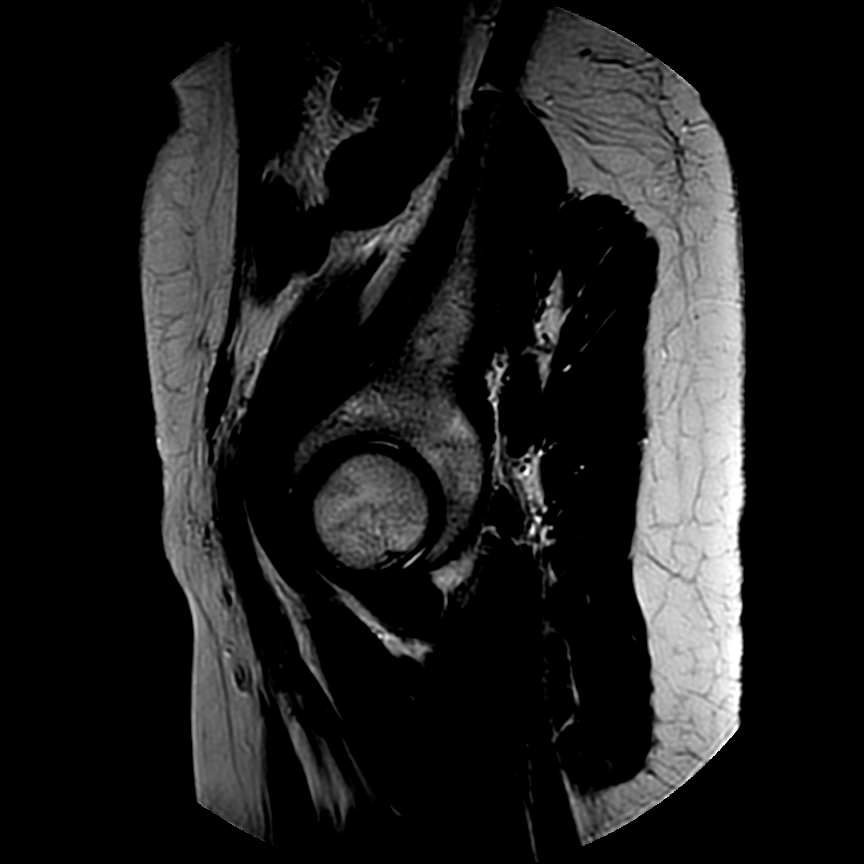

[T2 · sagittal · 4.0mm · 0.31mm/px · 1 of 30 slices shown (4 of 5)]
[im 1/30]
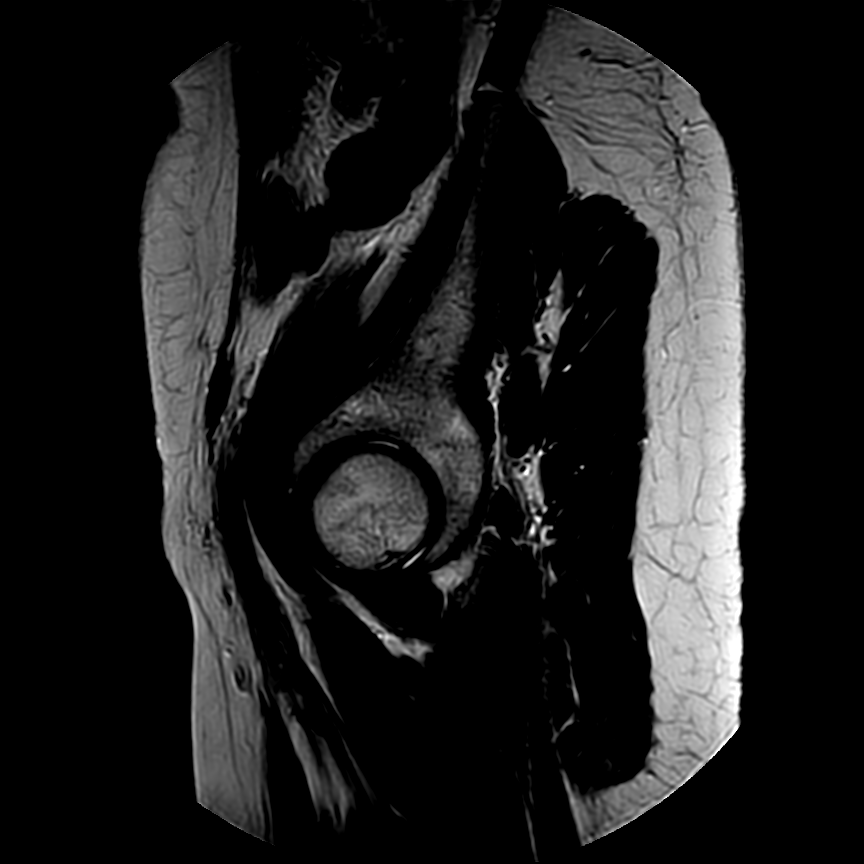

[T2 · axial · 4.0mm · 1.05mm/px · 1 of 40 slices shown (5 of 5)]
[im 1/40]
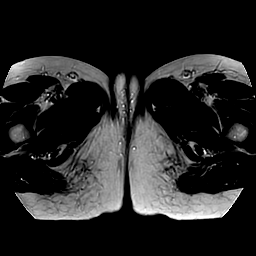

[axi difusao b(100-800) · axial · 4.0mm · 1.56mm/px · 1 of 40 slices shown (1 of 2)]
[im 1/40]
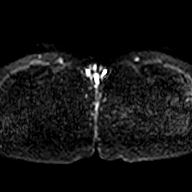

[axi difusao b(100-800) · axial · 4.0mm · 1.56mm/px · 1 of 40 slices shown (2 of 2)]
[im 1/40]
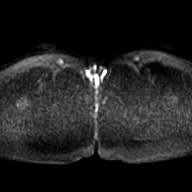

[saxi difusao b100 · axial · 4.0mm · 1.56mm/px · 1 of 40 slices shown]
[im 1/40]
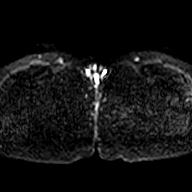

[saxi difusao b800 · axial · 4.0mm · 1.56mm/px · 1 of 40 slices shown]
[im 1/40]
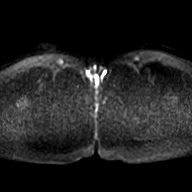

[ADC · axial · 4.0mm · 1.56mm/px · 1 of 40 slices shown]
[im 1/40]
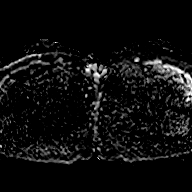

[(person_name)_bh · axial · 4.0mm · 1.30mm/px · z∈[-72,+126]mm · 8 of 400 slices shown]
[im 1/400]
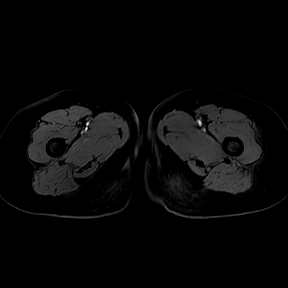
[im 67/400]
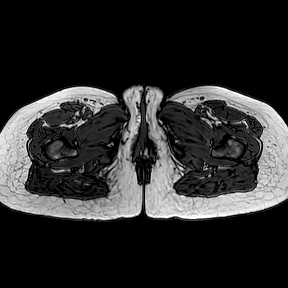
[im 134/400]
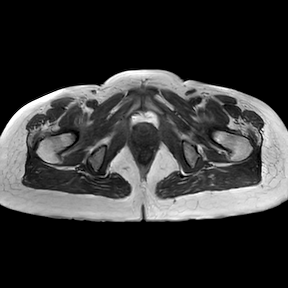
[im 167/400]
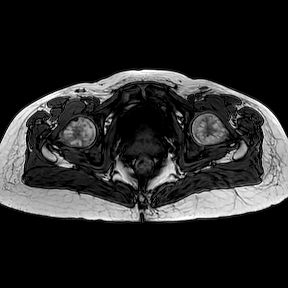
[im 233/400]
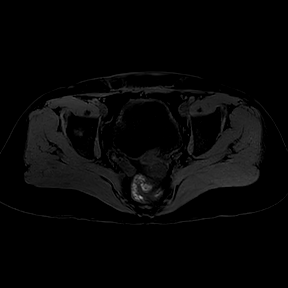
[im 267/400]
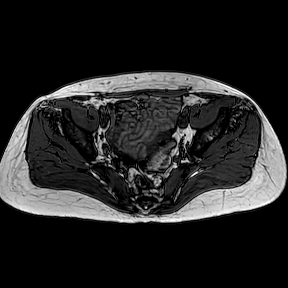
[im 333/400]
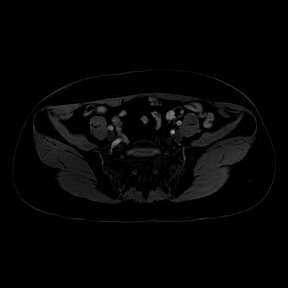
[im 400/400]
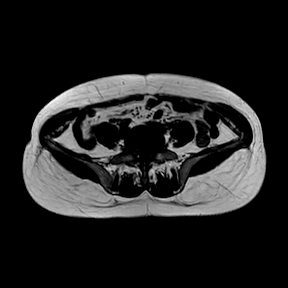

[(person_name) fs · axial · 4.0mm · 1.30mm/px · z∈[-72,+126]mm · 3 of 100 slices shown]
[im 1/100]
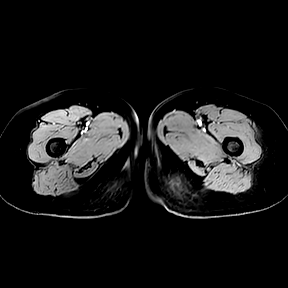
[im 50/100]
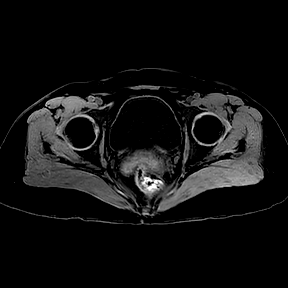
[im 100/100]
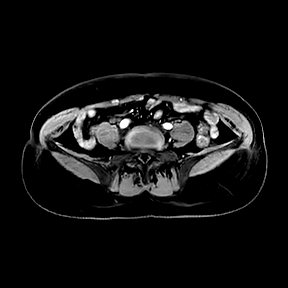

[T1 · axial · 4.0mm · 1.30mm/px · z∈[-72,+126]mm · 3 of 100 slices shown]
[im 1/100]
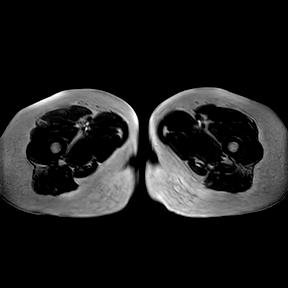
[im 50/100]
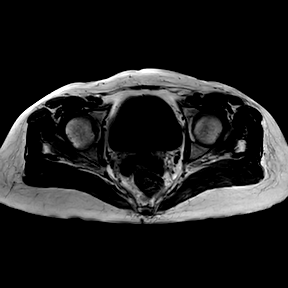
[im 100/100]
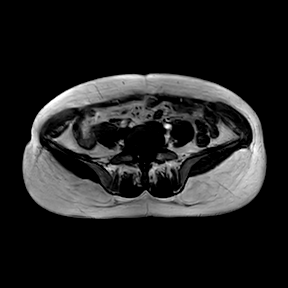

[saxi t(date) phase · axial · 4.0mm · 1.30mm/px · z∈[-72,+126]mm · 3 of 100 slices shown]
[im 1/100]
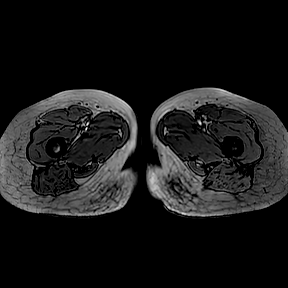
[im 50/100]
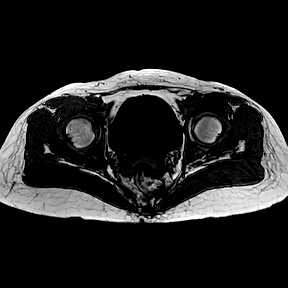
[im 100/100]
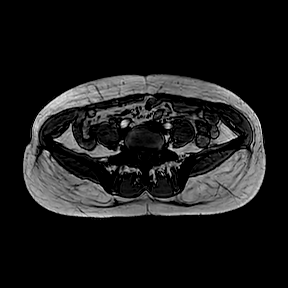

[29 of 48 positions shown; findings below may reference images not displayed]

METODOLOGIA: Aquisições multiplanares com sequências spin-eco, gradiente-eco e eco-planar, de acordo 
com o protocolo adequado para esse estudo, antes e após a injeção endovenosa do  gadolínio. 

ANÁLISE:
Bexiga sem falhas de enchimento, sangramento ou alteração parietal ao método.
Não há dilatação dos ureteres.
RESSONÂNCIA MAGNÉTICA DA PELVE FEMININA
A uretra é anatômica.
Espaço vesicovaginal e recesso vesicouterino sem alterações.
Útero não caracterizado, relacionado a histerectomia total prévia
Vagina com forma e sinal habituais.
Espaço retovaginal mantido.
Ovários não caracterizados.
Não há dilatação das tubas.
Não foi observada linfonodomegalia.
Pequena quantidade de líquido livre homogêneo na pelve.
IMPRESSÃO: 

Sinais de histerectomia.
Pequena quantidade de líquido livre na pelve.

## 2021-03-23 IMAGING — MR RM QUARIL DIREITO E ESQUERDO
10 of 21 series · 25 of 48 positions shown · non-contrast
Comparison: none

[survey 3 planos · axial · 10.0mm · 1.56mm/px · z∈[-74,+235]mm · 5 of 44 slices shown]
[im 1/44]
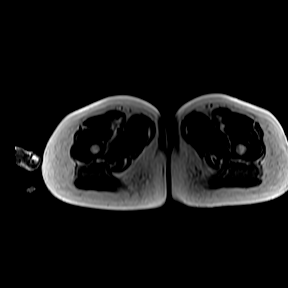
[im 11/44]
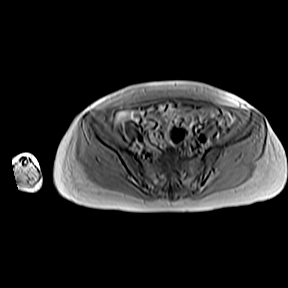
[im 22/44]
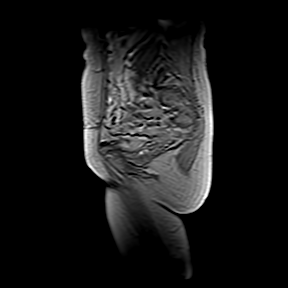
[im 33/44]
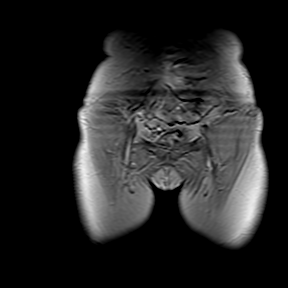
[im 44/44]
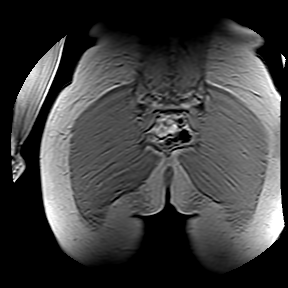

[T1 · coronal · 3.5mm · 0.31mm/px · 3 of 20 slices shown (1 of 3)]
[im 1/20]
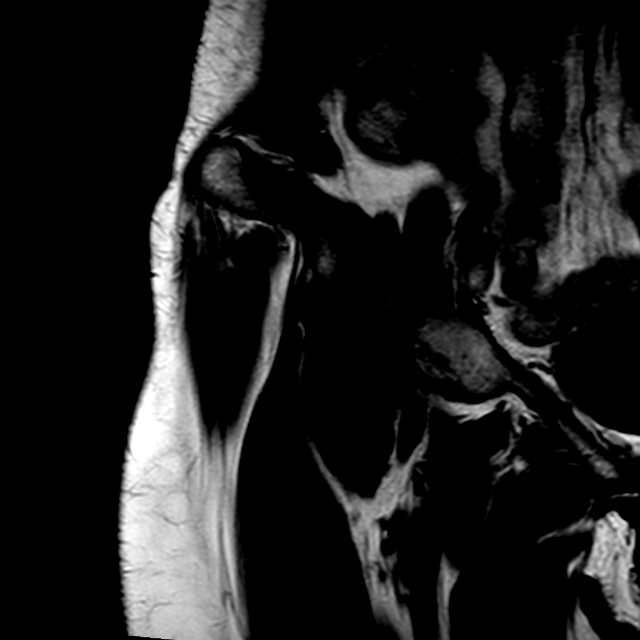
[im 10/20]
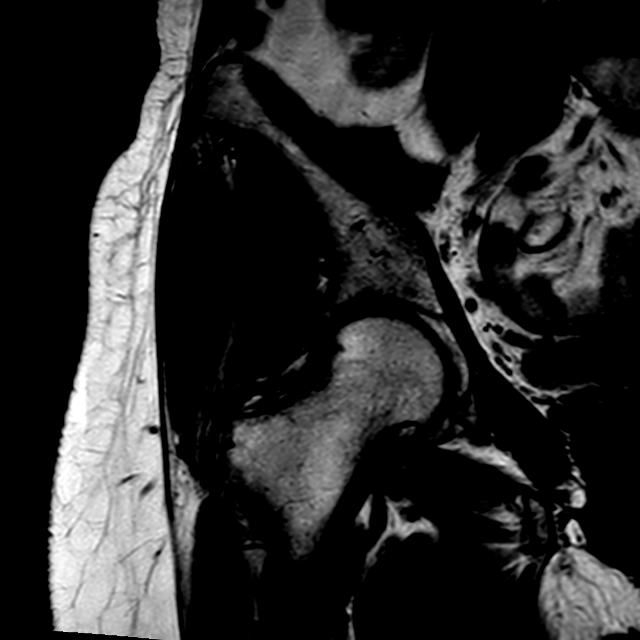
[im 20/20]
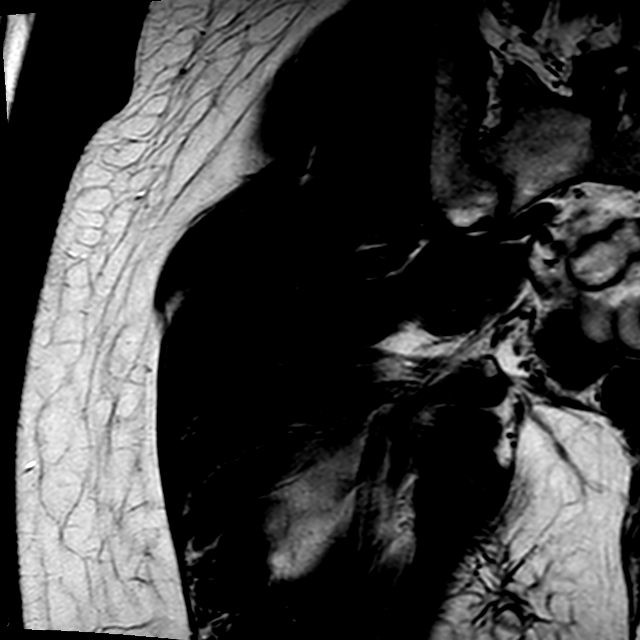

[T1 · coronal · 3.5mm · 0.31mm/px · 3 of 20 slices shown (2 of 3)]
[im 1/20]
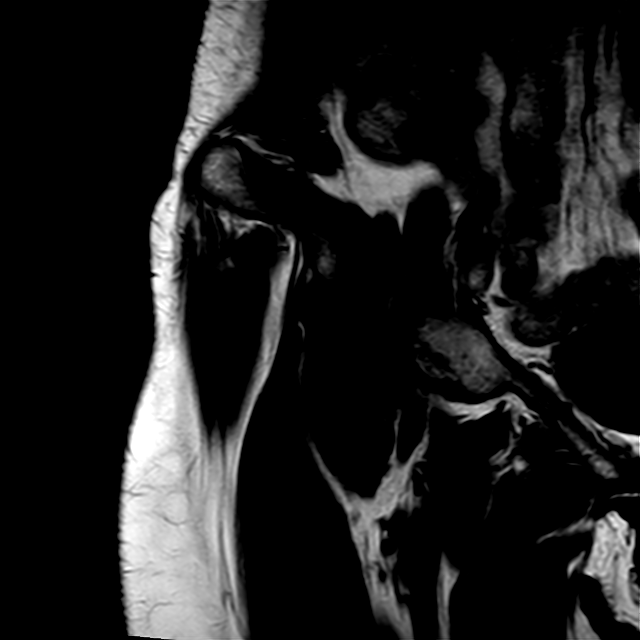
[im 10/20]
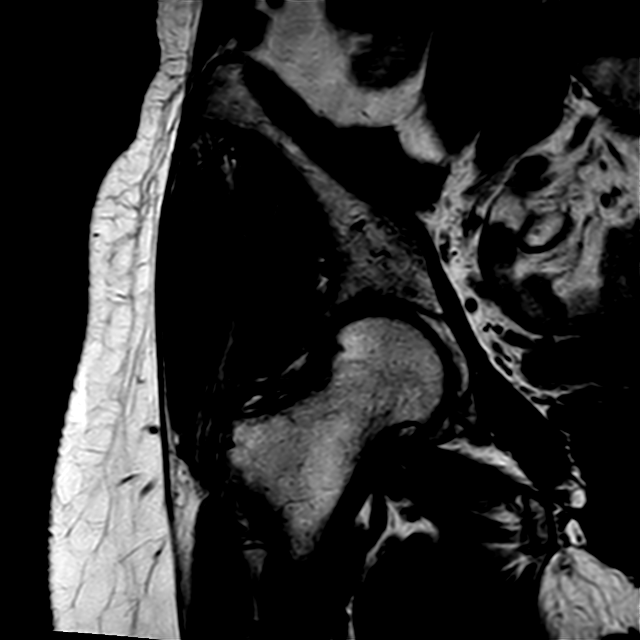
[im 20/20]
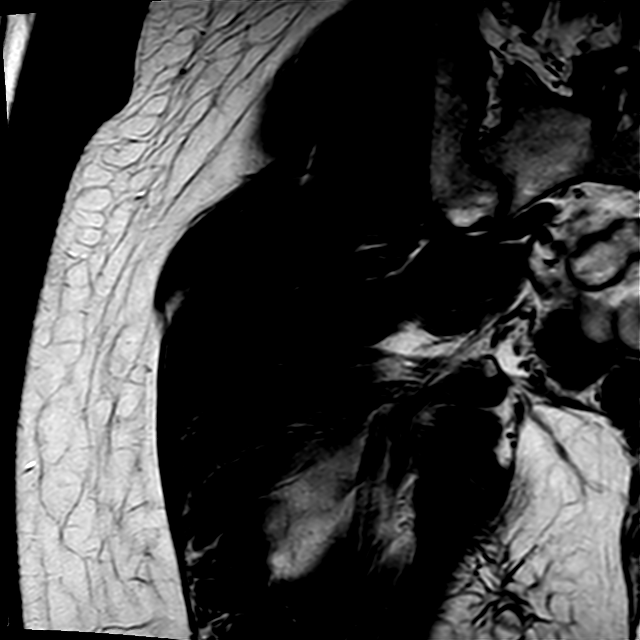

[T2 fat-sat · coronal · 3.5mm · 0.78mm/px · 3 of 20 slices shown (1 of 6)]
[im 1/20]
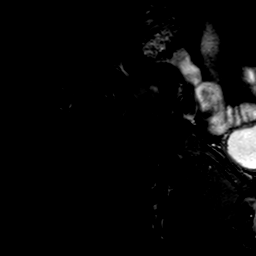
[im 10/20]
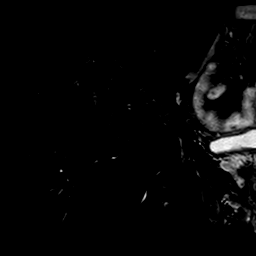
[im 20/20]
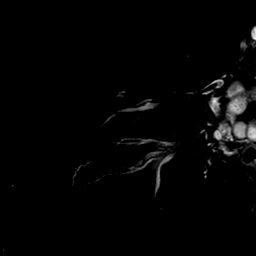

[T2 fat-sat · coronal · 3.5mm · 0.78mm/px · 2 of 20 slices shown (2 of 6)]
[im 1/20]
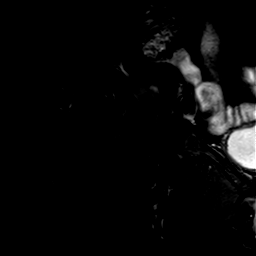
[im 20/20]
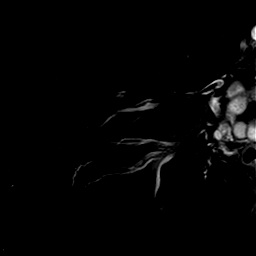

[T2 fat-sat · oblique · 3.5mm · 0.78mm/px · 2 of 25 slices shown (3 of 6)]
[im 1/25]
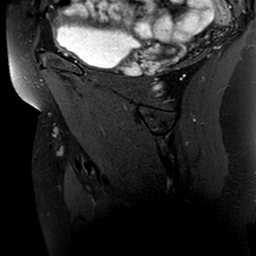
[im 25/25]
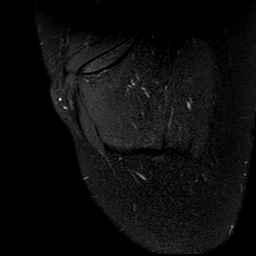

[T2 fat-sat · oblique · 3.5mm · 0.78mm/px · 2 of 25 slices shown (4 of 6)]
[im 1/25]
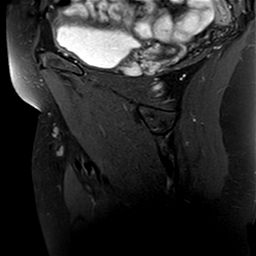
[im 25/25]
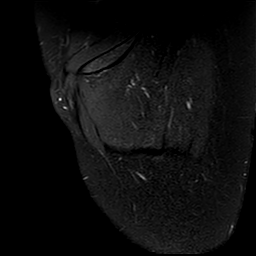

[T2 fat-sat · axial · 3.5mm · 0.39mm/px · z∈[-52,+59]mm · 2 of 25 slices shown (5 of 6)]
[im 1/25]
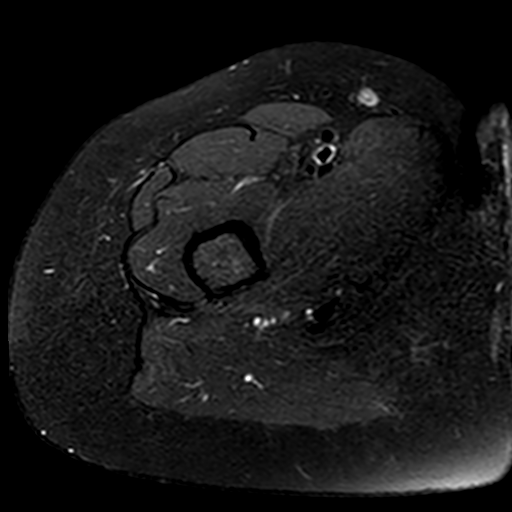
[im 25/25]
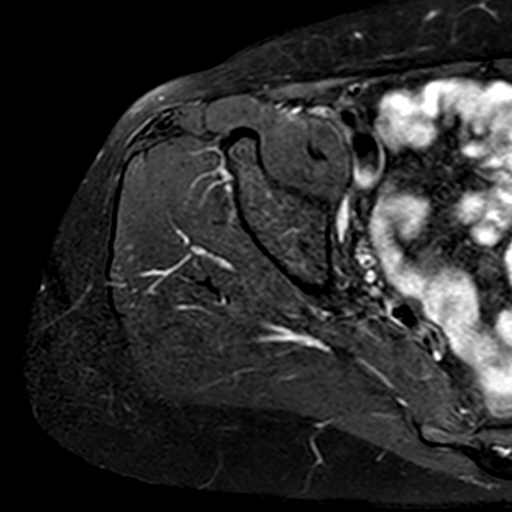

[T2 fat-sat · axial · 3.5mm · 0.39mm/px · z∈[-52,+59]mm · 2 of 25 slices shown (6 of 6)]
[im 1/25]
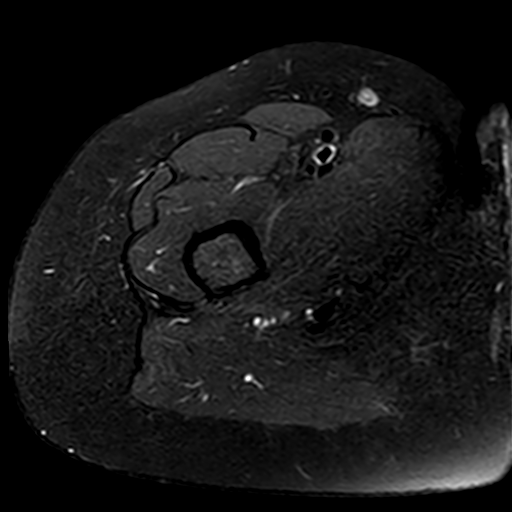
[im 25/25]
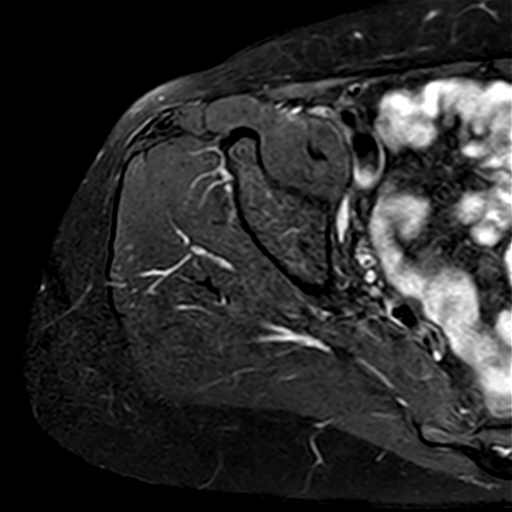

[T1 · axial · 3.5mm · 0.35mm/px · 1 of 25 slices shown (3 of 3)]
[im 1/25]
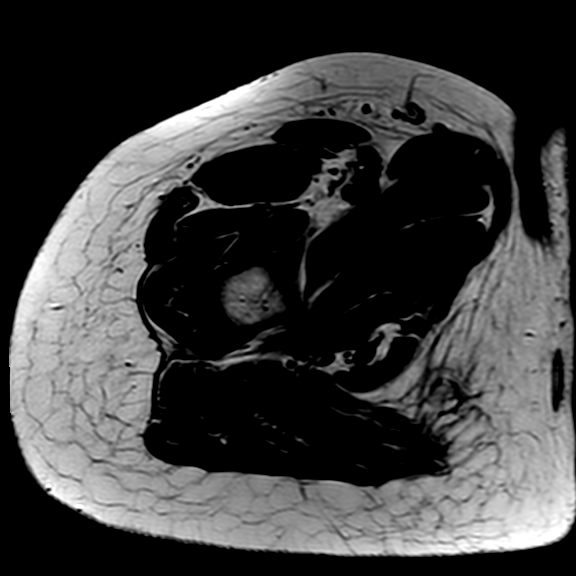

[25 of 48 positions shown; findings below may reference images not displayed]

METODOLOGIA:
Exame  realizado  com  sequências  ponderadas  em  T1  e  T2,  em  aquisições  multiplanares,  sem a 
administração do meio de contraste paramagnético por via endovenosa.
ANÁLISE:
Osteófitos marginais incipientes no teto acetabular e cabeça femoral associado a discreto afilamento e 
irregularidade dos revestimentos condrais.
RESSONÂNCIA MAGNÉTICA DO QUADRIL DIREITO
Lesão degenerativa da porção anterossuperior e superior do lábio acetabular caracterizado por irregularidade 
dos contornos e heterogeneidade labral, destacando-se a presença de fissura local que se estende a 
superfície articular.
Lesão degenerativa do ligamento redondo caracterizado por afilamento e heterogeneidade de suas fibras 
junto a fóvea femoral.
Alteração de sinal com padrão de edema do coxim gorduroso localizado entre o trato iliotibial e o trocânter 
maior do fêmur, aspecto que pode ser observado em pacientes assintomáticos ou estar relacionada a atrito / 
hipersolicitação na dependência de correlação clínica.
Tendinopatia associada a fissuras degenerativas intrassubstanciais dos isquiotibiais, sem transfixações.
Demais estruturas ósseas com morfologia e sinal medular conservados.
Superfícies condrais de contornos regulares, sem evidências de lesões osteocondrais.
Ausência de derrame articular.
Tendões glúteos médio e mínimo com espessura e sinal conservados, sem evidências de lesão.
Tendões retofemoral, iliopsoas preservados.
Ventres musculares da cintura pélvica de trofismo conservado, sem lesões.
IMPRESSÃO:
Osteófitos marginais incipientes no teto acetabular e cabeça femoral associado a discreto afilamento e 
irregularidade dos revestimentos condrais.
Lesão degenerativa da porção anterossuperior e superior do lábio acetabular.
Lesão degenerativa do ligamento redondo.
Alteração de sinal com padrão de edema do coxim gorduroso localizado entre o trato iliotibial e o trocânter 
maior do fêmur, aspecto que pode ser observado em pacientes assintomáticos ou estar relacionada a atrito / 
hipersolicitação na dependência de correlação clínica.
Tendinopatia associada a fissuras degenerativas intrassubstanciais dos isquiotibiais, sem transfixações.

## 2021-03-27 ENCOUNTER — Emergency Department: Payer: No Typology Code available for payment source

## 2021-03-27 ENCOUNTER — Emergency Department
Admission: EM | Admit: 2021-03-27 | Discharge: 2021-03-28 | Disposition: A | Discharge status: Home / Self Care | Payer: No Typology Code available for payment source | Attending: Emergency Medicine | Admitting: Emergency Medicine

## 2021-03-27 DIAGNOSIS — Y998 Other external cause status: Secondary | ICD-10-CM | POA: Insufficient documentation

## 2021-03-27 DIAGNOSIS — Y929 Unspecified place or not applicable: Secondary | ICD-10-CM

## 2021-03-27 DIAGNOSIS — S39012A Strain of muscle, fascia and tendon of lower back, initial encounter: Secondary | ICD-10-CM | POA: Insufficient documentation

## 2021-03-27 DIAGNOSIS — X58XXXA Exposure to other specified factors, initial encounter: Secondary | ICD-10-CM | POA: Insufficient documentation

## 2021-03-27 DIAGNOSIS — Y939 Activity, unspecified: Secondary | ICD-10-CM

## 2021-03-27 DIAGNOSIS — M5136 Other intervertebral disc degeneration, lumbar region: Secondary | ICD-10-CM

## 2021-03-27 DIAGNOSIS — Y9389 Activity, other specified: Secondary | ICD-10-CM | POA: Insufficient documentation

## 2021-03-27 DIAGNOSIS — M48061 Spinal stenosis, lumbar region without neurogenic claudication: Secondary | ICD-10-CM

## 2021-03-27 DIAGNOSIS — R402412 Glasgow coma scale score 13-15, at arrival to emergency department: Secondary | ICD-10-CM | POA: Insufficient documentation

## 2021-03-27 DIAGNOSIS — M4316 Spondylolisthesis, lumbar region: Secondary | ICD-10-CM

## 2021-03-27 DIAGNOSIS — Y9289 Other specified places as the place of occurrence of the external cause: Secondary | ICD-10-CM | POA: Insufficient documentation

## 2021-03-27 DIAGNOSIS — M47816 Spondylosis without myelopathy or radiculopathy, lumbar region: Secondary | ICD-10-CM

## 2021-03-27 HISTORY — DX: Anemia, unspecified: D64.9

## 2021-03-27 LAB — URINALYSIS WITH CULTURE REFLEX, WHEN INDICATED
Bilirubin, UA: NEGATIVE
Glucose, UA: NEGATIVE MG/DL
Ketones, UA: NEGATIVE MG/DL
Leukocyte Esterase, UA: NEGATIVE
Nitrite, UA: NEGATIVE
Protein, UA: NEGATIVE MG/DL
RBC, UA: <1 #/HPF (ref 0–3)
Specific Grav, UA: 1.015 (ref 1.003–1.030)
Squamous Epithelial, UA: 1 /HPF (ref 0–10)
UA Cult: NEGATIVE
Urobilinogen, UA: <2 MG/DL (ref <2.0)
WBC, UA: <1 #/HPF (ref 0–5)
pH, UA: 5 (ref 5.0–8.0)

## 2021-03-27 LAB — URINE HCG: Preg, UR (Qual): NEGATIVE

## 2021-03-27 MED ORDER — ACETAMINOPHEN 325 MG PO TABS
650.0000 mg | ORAL_TABLET | Freq: Once | ORAL | Status: DC
Start: 2021-03-27 — End: 2021-03-28

## 2021-03-27 NOTE — ED Notes (Signed)
CT in 30 min per CT tech

## 2021-03-27 NOTE — Discharge Instructions (Addendum)
Use lidoderm patch over affected area.    Take tylenol for pain  Ibuprofen for breakthrough pain    Call PMD to scheduled appointment for reevaluation of symptoms.  Return to ED for any concerns such as fevers, uncontrolled pain, weakness in legs, or any other concerns.

## 2021-03-27 NOTE — ED Provider Notes (Addendum)
CHIEF COMPLAINT  Low Back Pain (Back pain x 3 days, denies numbness or tingling. Unable to stand for long periods of time)          HISTORY OF PRESENT ILLNESS:   Elizabeth White is a 55 year old female who presents with low back pain.  Present for 3 days.  No trauma no specific trauma.  Midline.  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  No fevers .  No pain radiating down legs.  Normal urination normal bm, no urgency.  No previous h/o back pain.  .    Location: midline lumbar  Radiation: none  Quality: heavy, like something heavy on it   Severity: 8/10  Duration: 3 days  Timing: constant  No relief from icy hot    REVIEW OF SYSTEMS:  Constitutional: - fever  Head: - headache  CV: - chest pain  Resp: - shortness of breath  GI: - vomiting    All other systems reviewed and negative except as noted above      PAST MEDICAL HISTORY: none  Past Medical History:   Diagnosis Date   . Anemia       There are no problems to display for this patient.    SURGICAL HISTORY:  No past surgical history on file.   none    ALLERGIES:  No Known Allergies   nkda     CURRENT MEDICATIONS:   Please see nursing notes    FAMILY HISTORY:  Reviewed and considered non-contributory        SOCIAL HISTORY:  Tobacco: -  Alcohol: -  Drug use: -    VITAL SIGNS:  First Vitals [03/27/21 2047]   Temperature Heart Rate Respirations Blood pressure (BP) SpO2   98.1 F (36.7 C) 73 16 137/82 100 %       PHYSICAL EXAM:  General: Awake, Alert , appears to be in no apparent distress   Head: Normocephalic, atraumatic   Eyes: No scleral icterus, no conjunctival injection   ENT: Normal appearing ears externally, normal appearing nose externally   Neck: Supple, no tracheal deviation   Respiratory: Normal effort, no audible stridor , CTAB  Cardiovascular: Normal rate, regular rhythm, warm and well perfused        Abdomen: Soft and nontender  Back:no T spine tenderness, + L spine tenderness small area of swelling, approx 2 x 3 cm just off  mildine of lumbar region.  There is no warmth or redness.  Feels boggy like a lipoma but was just noted.    Skin: No jaundice, No rash   Extremities: No edema,   Neuro: Face symmetric, normal speech    MEDICAL DECISION MAKING:  Elizabeth White is a 55 year old female who presents with back pain. Differential diagnosis includes muscle spasm, lipoma, doubt abscess, doubt spinal fracture, doubt DJD With radiculopathy.  No evidence of neuro deficts.  Pain is lower than CVA area, doubt it is related to pyelo or renal cause. . Will obtain ct l spine    I have reviewed the patient's labs which were notable for normal UA,   I reviewed the patient's imaging which showed no acute disease.     Per rads:    There is no evidence of acute fracture, dislocation, or significant subluxation of the lumbar spine.  The vertebral body heights, alignment, and disc spaces are preserved.  There is no paraspinal hematoma.. I have also reviewed prior records which were summarized  in my HPI .    ED COURSE:  Workup Summary     There is no data filed.      discussed results with patient and daughter.  Trial of lidoderm and antiinflammatories. Follow up with PMD.     DIAGNOSIS:      ICD-10-CM ICD-9-CM   1. 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  She had follow up scheduled with pmd may 23,  Still having 6/10 pain, added medrol dose pack and suggested pmd make spinal referral + possible PT.  Answered all questions    Rolland Porter, MD  03/31/21 [phone]

## 2021-03-28 NOTE — ED Notes (Signed)
Pt was seen and re evaluated by EMD, dc'd to go home and to follow up with PMD, ACI were provided, pt verbalized back understood instructions. Pt A/O X4, stated back pain slightly better, pt able to ambulate with steady gait. VSs were updated.

## 2021-03-31 MED ORDER — METHYLPREDNISOLONE 4 MG OR KIT
ORAL_TABLET | ORAL | 0 refills | Status: DC
Start: 2021-03-31 — End: 2022-05-23

## 2021-03-31 MED ORDER — METHYLPREDNISOLONE 4 MG OR KIT
ORAL_TABLET | ORAL | 0 refills | Status: DC
Start: 2021-03-31 — End: 2021-03-31

## 2021-04-09 ENCOUNTER — Emergency Department
Admission: EM | Admit: 2021-04-09 | Discharge: 2021-04-09 | Disposition: A | Discharge status: Home / Self Care | Payer: No Typology Code available for payment source | Attending: Emergency Medicine | Admitting: Emergency Medicine

## 2021-04-09 DIAGNOSIS — M545 Low back pain, unspecified: Secondary | ICD-10-CM

## 2021-04-09 DIAGNOSIS — N9489 Other specified conditions associated with female genital organs and menstrual cycle: Secondary | ICD-10-CM | POA: Insufficient documentation

## 2021-04-09 MED ORDER — LIDOCAINE 5 % EX PTCH
1.0000 | MEDICATED_PATCH | Freq: Once | CUTANEOUS | Status: DC
Start: 2021-04-09 — End: 2021-04-09
  Administered 2021-04-09: 1 via TRANSDERMAL
  Filled 2021-04-09: qty 1

## 2021-04-09 MED ORDER — KETOROLAC TROMETHAMINE 30 MG/ML IJ SOLN
15.0000 mg | Freq: Once | INTRAMUSCULAR | Status: AC
Start: 2021-04-09 — End: 2021-04-09
  Administered 2021-04-09: 15 mg via INTRAMUSCULAR
  Filled 2021-04-09: qty 1

## 2021-04-09 NOTE — Discharge Instructions (Signed)
For pain, please take Tylenol 650 mg every 8 hours and ibuprofen 600 mg every 6 hours with food.     Please follow-up with your primary care doctor in 2-3 days or sooner if needed. The follow-up appointment is a very important part of your ongoing medical evaluation and management.    Return to the emergency department immediately for any new or worsening symptoms.

## 2021-04-09 NOTE — ED Notes (Signed)
Spoke with patient. Currently anxious and tearful. Denies any SI at this time. MD aware.

## 2021-04-09 NOTE — ED Provider Notes (Signed)
CHIEF COMPLAINT  Back Pain (Lower mid back. Back pain x2 weeks ago. Denies trauma. Denies numbness tingling. Was D/c for from ED for same complaint 2 weeks ago. )            HISTORY OF PRESENT ILLNESS:   Elizabeth White is a 55 year old female who presents with back pain. The pain is located in the mid back and moves down on both sides. She denies any inciting trauma or falls. She denies any dysuria or hematuria, fevers, or vomiting. She denies any lower extremity weakness. She denies any saddle anesthesia. She denies any bowel or bladder incontinence or retention.     She has been taking methylprednisolone, but no pain medications at home. She went to a primary care office today for follow up and decided not to see the provider because she did not feel comfortable with a female provider. The staff at the office told them to go to the ER.     She had a CT scan 5/13 showing the following:   IMPRESSION:    1. No acute lumbar spine fractures.    2. Mild L4-L5 and L5-S1 degenerative disc disease, severe bilateral L4-L5 facet joint osteoarthritis and mild bilateral L4-5 neuroforaminal stenosis. Dedicated lumbar spine MRI on nonemergent basis could be considered for further characterization.    3. Fat-containing right adnexal lesion with internal nonfatty solid components measuring approximately 5.4 x 5.5 x 2.7 cm most compatible with a dermoid/mature teratoma. The uterus is retroverted with thickening of the endometrial canal, incompletely evaluated on this CT examination. Comparison to prior imaging would be helpful, if available. Dedicated pelvic ultrasound on nonemergent basis is suggested for further characterization.      Location: mid back  Radiation: down  Quality: "like an object is in the back"   Severity: moderate  Duration: 2 weeks  Timing: intermittent, worse with movement    REVIEW OF SYSTEMS:  Constitutional: - fever  Head: - headache  CV: - chest pain  Resp: - shortness of breath  GI: -  vomiting    All other systems reviewed and negative except as noted above        PAST MEDICAL HISTORY:  Past Medical History:   Diagnosis Date    Anemia        SURGICAL HISTORY:  None    ALLERGIES:  No Known Allergies        CURRENT MEDICATIONS:   Please see nursing notes    FAMILY HISTORY:  Reviewed and considered non-contributory        SOCIAL HISTORY:  Tobacco: no  Alcohol: no    VITAL SIGNS:  First Vitals [04/09/21 1018]   Temperature Heart Rate Respirations Blood pressure (BP) SpO2   97.8 F (36.6 C) 85 18 148/86 98 %       PHYSICAL EXAM:  General: Awake, Alert, appears to be in minimal apparent distress   Head: Normocephalic, atraumatic  Eyes: No scleral icterus, no conjunctival injection  ENT: Normal appearing ears externally, normal appearing nose externally   Neck: Supple, no tracheal deviation   Respiratory: Normal effort, no audible stridor  Cardiovascular: Normal rate, regular rhythm, warm and well perfused        Back:L spine mildly tender to palpation, no deformity  Skin: No jaundice, No rash   Extremities: No edema, no asymmetry  Neuro: Face symmetric, normal speech. 5/5 symmetric strength in bilateral lower extremities. Normal gait    MEDICAL DECISION MAKING:  Scarlette Slice Goco  Thornell is a 55 year old female who presents with back pain. Differential diagnosis includes back pain secondary to known DDD. No red flag symptoms to suggest cauda equina or epidural abscess. No history of trauma or falls to suggest new fracture. Considered mass but unlikely with no mass on imaging 5/13. Will treat with toradol/lido patch and reassess.    I have also reviewed prior records which were summarized in my HPI.              ED COURSE:  Workup Summary       Value Comment By Time      Reassessed patient, feels improved and eager for discharge home. Will follow up with PCP regarding CT results as previously planned Kirtland Bouchard, MD 05/23 1143          DIAGNOSIS:    ICD-10-CM ICD-9-CM   1. Midline low back  pain without sciatica, unspecified chronicity  M54.50 724.2   2. Adnexal mass  N94.89 625.8        Kirtland Bouchard, MD  Resident  04/09/21 1428      Attending Attestation: I saw and examined the patient.  I discussed the case with the resident and agree with the findings and plan as documented by the resident.  My additions or revisions are included in the record.    Myrtice Lauth, MD, [place], [place], [place]  5:04 PM  04/09/21       Myrtice Lauth, MD  04/09/21 [phone]

## 2021-11-02 ENCOUNTER — Telehealth: Payer: Self-pay

## 2021-11-02 NOTE — Telephone Encounter (Signed)
Patient is returning Erika's call to set up an appointment to see GYN/ONC. Please assist.

## 2021-11-02 NOTE — Telephone Encounter (Signed)
Pt is requesting to make a New Patient appt referral [id number]. Attempted to reach erika . Please assist

## 2021-11-05 NOTE — Telephone Encounter (Signed)
Patient is returning Seibert call. Please asist.

## 2021-11-06 NOTE — Telephone Encounter (Signed)
Spoke with pt and scheduled first available appt, no further questions or concerns.

## 2021-11-07 ENCOUNTER — Telehealth: Payer: Self-pay

## 2021-11-07 NOTE — Telephone Encounter (Signed)
Spoke with pt's daughter to reschedule pt's upcoming appt on Thursday per Dr. Pete Glatter request. Awaiting call back to confirm new time on Tuesday 12/27.

## 2021-11-08 ENCOUNTER — Ambulatory Visit: Payer: No Typology Code available for payment source

## 2021-11-13 ENCOUNTER — Telehealth: Payer: Self-pay

## 2021-11-13 ENCOUNTER — Ambulatory Visit: Payer: No Typology Code available for payment source

## 2021-11-13 VITALS — BP 126/78 | HR 88 | Temp 98.1°F | Resp 17 | Ht 60.0 in | Wt 99.5 lb

## 2021-11-13 DIAGNOSIS — Z681 Body mass index (BMI) 19 or less, adult: Secondary | ICD-10-CM | POA: Insufficient documentation

## 2021-11-13 DIAGNOSIS — N83299 Other ovarian cyst, unspecified side: Secondary | ICD-10-CM

## 2021-11-13 DIAGNOSIS — N950 Postmenopausal bleeding: Secondary | ICD-10-CM | POA: Insufficient documentation

## 2021-11-13 MED ORDER — CHLORHEXIDINE GLUCONATE 0.12 % MT SOLN
OROMUCOSAL | Status: DC
Start: 2021-09-26 — End: 2022-05-23

## 2021-11-13 NOTE — Progress Notes (Signed)
Gyn Onc Attending Note  55yo P3 with history of adnexal cyst and post-menopausal bleeding.  On exam, mobile uterus retroverted, no discrete adnexal mass appreciated.  Recommend EMB, CT AP, tumor markers.  Patient would like to avoid surgery if possible, works with [employer] at [facility] [place].  RTC for biopsy in 1-2 weeks, all questions answered.  Patient in agreement with plan.   Elizabeth Balsam, MD  Attending, Gynecologic Oncology

## 2021-11-13 NOTE — Telephone Encounter (Signed)
Pt's relative requesting to be on the appointment over the phone. Per Analisa, pt's relative would need to call pt on via cell and ask the questions. Relayed information to caller.

## 2021-11-13 NOTE — Progress Notes (Signed)
 [facility]  Ambulatory Gyn Onc Consultation    Jani Moronta Juley Giovanetti  11/13/21     CC: 7 cm complex right adnexal mass    History of Present Illness:   Elizabeth White is a 55 year old G3P3 with a history of a 7cm right complex adnexal mass and PMB presenting for initial consultation. See full oncologic history below.     This all started 2020 when she presented to her doctor in the [place] for one episode of HMB. Says periods usually last 3 days and that period lasted 5 days so presented to her doctor who ordered an [place] which showed fibroids, incidentally found to have a right adnexal cyst. Denies any pain from the cyst so did not follow up for it.    Says she showed the [place] from the [place] to her PCP here who referred her to Gyn (Dr. Hal Neer). Also had CT A/P w/o contrast which showed a fat containing right adnexal lesion measuring 5.4 x 5.5 x 2.4 cm most consistent with dermoid cyst. The uterus is retroverted with thickening of the endometrial canal.    Most recent US 11/18 showed  R adnexal complex echogenic solid components with low level echoes 6.7 x 3.6 x 6.3 cm with no color flow seen. Two left simplex ovarian cysts 1.4 and 1.5 cm. Endometrium 1.4 cm. Seen by Dr. Hal Neer at [facility], and "patient now willing to have surgery and wants to go to [facility]."    In regards to bleeding patient reports she was having normal menses, not heavy, no pain lasting 3-4 days until 03/2021. Then had 2 days of light bleeding in November. Not currently bleeding, denies abdominal pain, nausea, vomiting, fevers, chills.     She denies any bloating, early satiety, changes in bowel/bladder function or reflux symptoms. No vaginal bleeding or discharge. Also denies pelvic pain, dysuria and hematuria. She further denies any fevers, chills, chest pain, SOB, nausea/vomiting, diarrhea, constipation, dysuria, or calf tenderness. No difficulty urinating or  defecating.     Interview conducted in: English    Oncologic History:   03/27/21 CT A/P w/o contrast a fat containing right adnexal lesion measuring 5.4 x 5.5 x 2.4 cm most consistent with dermoid cyst. The uterus is retroverted with thickening of the endometrial canal.  05/2021: [place]-125 24  10/05/21 Pelvic US retroverted 9.7 cm fibroid uterus with a R adnexal complex echogenic solid components with low level echoes 6.7 x 3.6 x 6.3 cm with no color flow seen. Two left simplex ovarian cysts 1.4 and 1.5 cm. Endometrium 1.4 cm. No pelvic fluid.    Gynecologic History:  OB History   Gravida Para Term Preterm AB Living   3 3 3  0 0 3   SAB IAB Ectopic Multiple Live Births   0 0 0 0 3   Obstetric Comments   SVD x3      Gyn History     LMP: Postmenopausal    Age at Menarche: 76    Age at First Pregnancy: 48    Age at Menopause:     Gyn History Comments:     Sexual Activity: No sexual activity data on record; No partner data on record    Contraception: No contraception data on record      31, 27, 24    HRT: none  Pap: 06/21/21 normal  Mammogram: 8/15/222 benign dense breast; breast US benign R breast nodule, f/u 6 months  Colonoscopy: never had    Past Medical History:  There is no problem list on file for this patient.       Past Surgical History:  Past Surgical History:   Procedure Laterality Date   . TUBAL LIGATION Bilateral 1998       Family History:   Family History   Problem Relation Name Age of Onset   . Colorectal Cancer Sister         Social History:   Social History     Socioeconomic History   . Marital status: Divorced   Tobacco Use   . Smoking status: Never   . Smokeless tobacco: Never   Substance and Sexual Activity   . Alcohol use: Never   . Drug use: Never       Medications:   Current Outpatient Medications   Medication Sig   . chlorhexidine (PERIDEX) 0.12 % solution RINSE TWICE DAILY BY MOUTH FOR 1 MINUTE   . methylPREDNISolone (MEDROL DOSEPACK) 4 MG tablet Take as directed on package     No current  facility-administered medications for this visit.       Allergies:   No Known Allergies    Review of Systems:  14-point ROS was performed.  Pertinent positives and negatives as in HPI.    Objective:  Vitals:    11/13/21 1614   BP: 126/78   Pulse: 88   Resp: 17   Temp: 98.1 F (36.7 C)   TempSrc: Temporal   Weight: (!) 45.1 kg (99 lb 8.6 oz)   Height: 5' (1.524 m)       Physical Exam:  General: alert, oriented, no distress  HEENT: atraumatic/normocephalic, moist mucus membranes, sclera-white, conjunctiva-pink, no supraclavicular lymphadenopathy  CV: regular rate and rhythm, normal S1/S2, no murmurs or extra heart sounds   Respiratory: clear to auscultation bilaterally, no wheezes/crackles/rhonchi   Abdomen:  soft, nontender, nondistended   Back: no CVA tenderness  Vaginal Exam:  Normal appearing external genitalia. No cervical or vaginal lesions. Mobile uterus retroverted, no discrete adnexal mass appreciated.  Extremitis: no clubbing, cyanosis, erythema    Lymph: no cervical, axillary, inguinal lymphadenopathy    Chaperone present for sensitive exam    Labs:  No visits with results within 1 Month(s) from this visit.   Latest known visit with results is:   Admission on 03/27/2021, Discharged on 03/28/2021   Component Date Value Ref Range Status   . UR Sample Site, UA 03/27/2021 URINE,TYPE NOT SPECIFIED   Final    URINE, CLEAN VOID   . Color, UA 03/27/2021 YELLOW   Final   . Clarity 03/27/2021 CLEAR   Final   . Specific Grav, UA 03/27/2021 1.015  1.003 - 1.030 Final   . pH, UA 03/27/2021 5  5.0 - 8.0 Final   . Protein, UA 03/27/2021 NEGATIVE  NEGATIVE MG/DL Final   . Glucose, UA 62/69/4854 NEGATIVE  NEGATIVE MG/DL Final   . Ketones, UA 62/70/3500 NEGATIVE  NEGATIVE MG/DL Final   . Bilirubin, UA 03/27/2021 NEGATIVE  NEGATIVE Final   . Hemoglobin, UA 03/27/2021 SMALL (A)  NEGATIVE Final   . Leukocyte Esterase, UA 03/27/2021 NEGATIVE  NEGATIVE Final   . Nitrite, UA 03/27/2021 NEGATIVE  NEGATIVE Final   . Urobilinogen,  UA 03/27/2021 <2  <2.0 MG/DL Final   . RBC, UA 93/81/8299 <1  0 - 3 #/HPF Final   . WBC, UA 03/27/2021 <1  0 - 5 #/HPF Final   . WBC Clumps, UA 03/27/2021 NONE  NONE #/HPF Final   . Bacteria, UA 03/27/2021 FEW (A)  NONE Final   . UA Cult 03/27/2021 CULTURE PARAMETERS NEGATIVE, URINE NOT SENT TO MICROBIOLOGY   Final   . Squamous Epithelial, UA 03/27/2021 1  0 - 10 /HPF Final   . Mucous, UA 03/27/2021 NONE  NONE /LPF Final   . Preg, UR (Qual) 03/27/2021 NEGATIVE   Final        Imaging:          Pathology:   none    Assessment:  Caylor Cerino is a 55 year old G3P3 with a history of a 7cm right complex adnexal mass and PMB presenting for initial consultation.    Reviewed imaging reports for adnexal cyst, no images available to view. Last CT scan w/o contrast 03/2021. Recommend repeat imaging - CT A/P w/ IV/PO con, TVUS ordered. Tumor markers ordered given complex mass although 03/2021 CT scan notes likely dermoid cyst. Then given PMB, recommend EMB, auth placed today and pt to RTC 1-2 weeks for biopsy. Will await results of biopsy, imaging and labs as patient would like to avoid surgery if possible    Plan:   1. Repeat imaging: CT A/P w/ IV/PO contrast, TVUS  2. Tumor markers: [place]-125, CEA, [place] 19-9 ordered  3. Endometrial biopsy: auth placed today  4. RTC 1-2 weeks for EMB.    All the patient's questions were answered.    Seen and discussed with Dr. Sherron Monday (A).    Stancil Deisher Lianne Bushy, MD  PGY-3, Obstetrics and Gynecology      Please contact the Gynecologic Oncology pager [phone].

## 2021-12-12 ENCOUNTER — Ambulatory Visit: Payer: No Typology Code available for payment source

## 2021-12-21 ENCOUNTER — Inpatient Hospital Stay: Admit: 2021-12-21 | Discharge: 2021-12-21 | Disposition: A | Discharge status: Home Health | Payer: Self-pay

## 2021-12-27 ENCOUNTER — Other Ambulatory Visit: Payer: Self-pay

## 2021-12-27 ENCOUNTER — Ambulatory Visit: Payer: No Typology Code available for payment source

## 2021-12-27 VITALS — BP 119/80 | HR 84 | Temp 98.2°F | Resp 16 | Ht 60.0 in | Wt 98.7 lb

## 2021-12-27 DIAGNOSIS — D369 Benign neoplasm, unspecified site: Secondary | ICD-10-CM

## 2021-12-27 DIAGNOSIS — N950 Postmenopausal bleeding: Secondary | ICD-10-CM

## 2021-12-27 NOTE — Progress Notes (Signed)
 Procedure Note: Endometrial Biopsy      Date:  12/27/21   Patient:  Elizabeth White   Medical Record #: [id number]       Patient is a 56 year old G3P3003 presenting for endometrial biopsy.       Prior cervical/vaginal disease: None    Prior cervica

## 2021-12-27 NOTE — Progress Notes (Signed)
 [facility]   Ambulatory Gyn Onc Follow Up Note    Elizabeth White  12/27/2021    CC: 7 cm complex right adnexal mass    Reason for Visit: Follow-up (EMB)    HPI: Elizabeth White is a 56 year old G3P3 with a

## 2021-12-27 NOTE — Progress Notes (Signed)
 Gyn Onc Attending Note  55yo P3 with history of adnexal cyst and post-menopausal bleeding here for follow-up.  Reviewed CT and ultrasound with 7cm adnexal mass, no evidence of metastatic disease, likely dermoid.  EMB performed today, patient tolerated proc

## 2021-12-28 ENCOUNTER — Other Ambulatory Visit
Admission: RE | Admit: 2021-12-28 | Discharge: 2021-12-28 | Disposition: A | Discharge status: Home / Self Care | Payer: No Typology Code available for payment source | Source: Ambulatory Visit

## 2021-12-28 DIAGNOSIS — N858 Other specified noninflammatory disorders of uterus: Secondary | ICD-10-CM | POA: Insufficient documentation

## 2021-12-28 NOTE — Progress Notes (Signed)
 CT A/P from 1/23 fax    Positive findings:    Kidneys/Ureters: No e/o calculus or hydronephrosis. There are bilateral cortical cyst measuring 1.8 cm on the right and 0.5 cm on the left.    Pelvis: No e/o lymphadenopathy. The uterus shows no focal mass. The

## 2021-12-28 NOTE — Addendum Note (Signed)
Addended by: Merideth Abbey on: 12/28/2021 09:29 AM     Modules accepted: Orders

## 2022-01-01 LAB — PATHOLOGY TISSUE EXAM - ~~LOC~~

## 2022-01-01 LAB — HISTORICAL HISTOLOGY DATA

## 2022-01-03 ENCOUNTER — Telehealth: Payer: Self-pay

## 2022-01-03 LAB — CEA TUMOR, BLOOD: CEA: <2 ng/mL

## 2022-01-03 LAB — CANCER CA125, BLOOD: ~~LOC~~ 125: 25 U/mL (ref <35)

## 2022-01-03 LAB — ~~LOC~~ 19-9, BLOOD: ~~LOC~~ 19-9: <3 U/mL (ref <34)

## 2022-01-03 NOTE — Telephone Encounter (Signed)
Patient says she is returning call from Dr Pete Glatter RN, transferred to [facility]

## 2022-01-10 ENCOUNTER — Telehealth: Payer: Self-pay

## 2022-01-10 NOTE — Telephone Encounter (Signed)
Left voicemail, MRI approved to [place]. Number provided.

## 2022-01-21 ENCOUNTER — Telehealth: Payer: Self-pay

## 2022-01-21 NOTE — Telephone Encounter (Signed)
Pt requesting to speak to MD/Nurse regarding pt will received MRI on  3/8 at 2 pm and pt is requesting to change appt, but call center showing available until mid April. Transferred call to [place].     Please assist.

## 2022-01-23 ENCOUNTER — Ambulatory Visit: Payer: No Typology Code available for payment source

## 2022-01-29 ENCOUNTER — Ambulatory Visit: Payer: No Typology Code available for payment source

## 2022-01-29 DIAGNOSIS — N9489 Other specified conditions associated with female genital organs and menstrual cycle: Secondary | ICD-10-CM | POA: Insufficient documentation

## 2022-01-29 NOTE — Progress Notes (Signed)
 [facility]   Ambulatory Gyn Onc Follow Up Note    Elizabeth White  Tuesday January 29, 2022    Due to COVID-19 pandemic and a federally declared state of public health emergency, this telemedicine visit was conducted A

## 2022-02-15 ENCOUNTER — Telehealth: Payer: Self-pay

## 2022-02-15 NOTE — Telephone Encounter (Signed)
Patient requesting for RN Vania Rea, transferred to [place]

## 2022-03-05 ENCOUNTER — Telehealth: Payer: Self-pay

## 2022-03-05 NOTE — Telephone Encounter (Signed)
Pt is requesting to speak to nurse regarding radiation report.Attempted to contact clinic Please assist

## 2022-03-06 ENCOUNTER — Telehealth: Payer: Self-pay

## 2022-03-06 NOTE — Telephone Encounter (Signed)
Patient calling to speak with nurse/md in regards of [place] results. paitent states she has her results, informed her she ill need to set up appt and bring it in with her for dr to view. Patient declined faxing results or uploading via mychart. Patient then started screaming and crying stating that no on has returned her call to schedule an appt for her. Made an attempt to call clinic. Please call back to assist.

## 2022-03-06 NOTE — Telephone Encounter (Signed)
Patient calling back, requesting a call back regards to [place] and Radiation report.     Also is requesting a Telemedicine appt with Dr Wilfred Curtis. Please call back to assist.

## 2022-03-06 NOTE — Telephone Encounter (Signed)
Spoke to patient to schedule appt to review ultrasound results. Pt verbalized being very stressed by report showing new desmoid cyst. Offered pt to be seen tomorrow but pt declined stating she is only available on Tuesdays and Wednesdays. Booked for next soonest on 5/2. Pt confirmed date/time.

## 2022-03-19 ENCOUNTER — Ambulatory Visit: Payer: No Typology Code available for payment source

## 2022-03-19 VITALS — BP 119/63 | HR 84 | Temp 97.0°F | Resp 16 | Ht 60.0 in | Wt 102.1 lb

## 2022-03-19 DIAGNOSIS — N9489 Other specified conditions associated with female genital organs and menstrual cycle: Secondary | ICD-10-CM | POA: Insufficient documentation

## 2022-03-19 NOTE — Progress Notes (Signed)
[facility]   Ambulatory Gyn Onc Follow Up Note    Elizabeth White  Tuesday [birth year]    CC: 7 cm complex right adnexal mass    Reason for Visit: Follow up    HPI: Elizabeth White is a 56 year old G3P3 with a history of a 7cm right complex adnexal mass and PMB, presenting for follow-up. Please see below for full details of her oncology history.    Patient was last seen via [place] on 01/29/2022, at which time had no new complaints. The plan was to refer to Gynecology for surveillance of adnexal mass given patient declined surgery at that time.    Ca125 01/02/22: 25  CEA 01/02/22: <2  Ca19-9 01/02/22: <3    On today's visit, patient is doing overall well. She would prefer to avoid surgery if possible however will do whatever we think is best.     Oncologic History:    03/27/2021 CT A/P w/o Con: A fat containing right adnexal lesion measuring 5.4 x 5.5 x 2.4 cm most consistent with dermoid cyst. The uterus is retroverted with thickening of the endometrial canal.  05/2021: [place]-125 24  10/05/2021 - TVUS: Retroverted 9.7 cm fibroid uterus with a R adnexal complex echogenic solid components with low level echoes 6.7 x 3.6 x 6.3 cm with no color flow seen. Two left simplex ovarian cysts 1.4 and 1.5 cm. Endometrium 1.4 cm. No pelvic fluid.  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 Nonvisualization of the left ovary. No other abnormality is noted within the pelvis.    Labs:  [place]-125:  05/2021: 24 (OSH)    [place]-125  Lab Results   Component Value Date/Time    CA125 25 01/02/2022 09:38 AM     [place] 19-9  Lab Results    Component Value Date/Time    CA199 <3 01/02/2022 09:38 AM     CEA  Lab Results   Component Value Date/Time    CEA <2.0 01/02/2022 09:38 AM     Imaging:  12/21/21: TVUS  probably benign uterine fibroid in upper body. 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 EMS 0.6 cm    01/23/2022: MRI Pelvis  Findings:  The uterus is retroverted. There are no uterine masses. The endometrial lining appears unremarkable. The cervix and vagina appear grossly unremarkable. The left ovary is difficult to localize with certainty. A normal right ovary is not localized with certainty. There is however a right adnexal fatty mass posteriorly which is elongated and lobulated measuring 3.0 x 4.4 x 5.4 cm. There is a small intraluminal soft tissue components measuring overall 2.0 x 1.1 x 1.5 cm. There is probably enhancement of the solid component with IV contrast.  There is no presacral mass. There is a normal appearance to the bladder. No abnormal masses are readily appreciated in the pelvis. Marrow signal within the bony structures is unremarkable. There are no bony masses.    Conclusions:  1. There is a fatty mass within the right adnexal region with a small soft tissue component noted above, likely to represent  a dermoid tumor.  2. Nonvisualization of the left ovary. No other abnormality is noted within the pelvis.    Pathology:    FINAL DIAGNOSIS AFTER MICROSCOPY:     ENDOMETRIUM, BIOPSY:    -   Mucous and few benign weakly proliferative endometrium.   -   Negative for hyperplasia or malignancy.     Review of Systems:  Pertinent positives and negatives are noted in HPI.    Physical Exam:  Vital signs: Blood pressure (BP): 119/63  Heart Rate: 84  Temperature: 97 F (36.1 C)  Respirations: 16  Height: 5' (152.4 cm)  Weight: (!) 46.3 kg (102 lb 1.2 oz)  General: alert, oriented, no distress  Abdomen: soft, nontender, nondistended  Ext: no peripheral edema  Pelvic: deferred    Assessment: Elizabeth White is a 56 year old G3P3 with a history of a 7cm right complex adnexal mass and PMB, presenting for follow-up.     Some interval growth in size of tumor, patient now desires surgery. We explained to her that we could send the tumor for frozen section during the surgery and potentially would perform a more extensive surgery. Patient declined and instead prefers to do interval surgeries (Dx lap for tumor removal, wait for results, then re-assess).     Plan:   - Repeat CT, tumor markers  - Telehealth in 2 weeks to review images  - Plan for surgery as described below    # Consents signed, pre-op orders placed   # Preop Labs: COVID, CBC, BMP, T&S, bHCG on DOS, Ca125, CEA, Ca199  # CXR: not indicated  # EKG: indicated  # Preoperative Imaging: CT abd/pelvis  # Medical optimization and risk stratification: not indicated  # Cardiac optimization and risk stratification: not indicated     PROCEDURE: Pelvic examination under anesthesia, diagnostic laparoscopy, right versus left salpingo-oophorectomy    POSSIBLE: exploratory laparotomy, bilateral salpingo-oophorectomy, dilation and curettage     Risks, benefits, alternatives and possible complications have been discussed in detail with the patient. Pre-admission, admission, and post admission procedures and expectations were discussed in detail. All questions answered, all appropriate consents will be signed.      The patient was given informed consent of the risks/benefits and alternatives to the above procedure(s). The patient understands the surgical risks including but not limited to infection, pain, blood clots, bleeding, bleeding sufficient enough to require a blood transfusion (with attendant risks of HIV and Hep C of 1/2 million, and Hep B of 1/200,000). She also understood the risk of injury to neighboring organs such as the bowel, rectum, bladder, blood vessels and nerves, possibly requiring further surgery, risk of uterine perforation. She denies any personal  beliefs against blood transfusion.  Consent was signed.    Reviewed plan for clear fluids for 24 hours, NPO for 8 hours prior to surgery.    Patient was seen, examined, and counseled by Dr. Wilfred Curtis.     Note prepared by medical scribe, Elenora Fender.     Freddie Apley, MD MSc  [facility], PGY-3

## 2022-03-19 NOTE — Patient Instructions (Signed)
Pre-Operative Teaching Summary    Go to lab for blood work as instructed  Your insurance will decide where you can get your labs completed    Complete any scans, EKGs and x-rays ordered within 2-4 weeks of your planned surgery  Radiology will call you directly to schedule your scans.   If you have not been contacted to schedule your scan within 1 week, please call the scheduling department to check the status of your authorization  Radiology scheduling:[phone]  If your scan is not performed at [facility], please ask the radiologist for a copy of your images on a CD. If possible, this CD should be dropped off at the clinic for your surgeon to review before your surgery  EKG Scheduling is [phone]    Once your insurance has authorized your procedure, you will be contacted by our Surgery scheduler to arrange for a surgery date  Surgery Schedulers (Melissa Trinh, April Hines, Dana Nguyen) contact number:  [phone]  Fax number:  [phone]    If pre-operative medical or cardiac clearance is required by your surgeon:   For PPO insurance or Medicare A/B plans: You will be called by the [facility] clinic to schedule an appointment. You also have the option of seeing your private physician.  OR  For HMO insurance plans: You may see your in-network primary care provider or cardiologist for an appointment.   The clearance paperwork can then be faxed to the surgery scheduler at: [phone]    Arrange for a driver for the day of surgery.  You will be asked to arrive 2 hours prior to your scheduled surgery time. However, this time may change based on the availability of the operating room.          The day prior to your surgery:    No SOLID foods after midnight the night before your surgery.   Jello, Gum, Candies, Breath mints are considered solid    You may continue to drink clear liquids up until 4 hours before coming to the hospital. NO clear liquids or anything in your mouth 4 hours before coming to the  hospital.    Clear liquids include:  Water  Apple Juice  Coffee or tea (NO non-dairy creamer or milk)  Clear chicken, beef or vegetable broth    Your doctor will instruct you if a bowel preparation is necessary  Instructions for the Chlorhexidine soap shower can be found inside the soap package        Diabetic Patients      No Solid food after midnight regardless of time of surgery -   Jello, Gum, Candies, Breath mints are considered solid     Liberal clear liquids up to 4 hours before surgery time.   Clear liquids include:  Water  fruit juices without pulp (no [place] juice)  Coffee or tea (NO non-dairy creamer or milk)  Clear chicken, beef or vegetable broth     Monitor blood sugar level at home at least once prior to arrival to [place] for an afternoon surgery      REGARDLESS of surgery time, if feeling HYPOGLYCEMIC or blood sugar level is less than 80, treat with APPLE JUICE and recheck blood sugar level within 30 minutes of having apple juice    On arrival for surgery, inform desk in [facility] on 2nd floor or receptionist about low blood sugar         The day of your surgery    No clear liquids,   gum, candy or anything in your mouth within 4 hours of coming to the hospital  Bring a picture ID and your insurance card on day of surgery  Do not bring valuables (jewelry, cash, etc.)        Important Medication Information for the Day of surgery    Blood pressure medication  Continue the day of surgery  Ask your doctor if you take an ACE-Inhibitor or an ARB as this may need to be stopped  ACE-I examples: Lisinopril, Benazepril, Enalapril  ARB examples: Losartan, Valsartan, Telmisartan    Diabetes medication  Do NOT take Metformin OR other oral diabetes medications  Do NOT take injectable medications (such a Victoza)  Insulin  Discuss with your primary care provider    Blood thinners  You should discuss with your primary care provider if you take any of the following  medications:  Aspirin  Warfarin/Coumadin  Plavix  Pradaxa  Xarelto  Eliquis  Lovenox/ Heparin    Pain Medications  Do NOT take Ibuprofen/Motrin and Indomethacin/Indocin within 24 hours of your procedure  Do NOT take Naproxen/Naprosyn within 72 hours of your procedure  Do NOT take Celecoxib/Celebrex or Meloxicam/Mobic within 10 days of your procedure  Rheumatological Medications  Continue Methotrexate and Plaquenil/Hydroxychloroquine including the day of surgery  You should discuss medications such a Humira with your Rheumatologist or PCP

## 2022-03-19 NOTE — Progress Notes (Signed)
Gyn Onc Attending Note  56yo P3 with history of 7cm adnexal mass and abnormal uterine bleeding presenting for follow-up.  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  At last visit, patient was recommended to have surgical removal however strongly declined.  Today, patient is interested in surgical removal, however would like to have MIS approach if possible.  Patient would like to have least amount of procedures possible, would prefer to wait for final pathology before proceeding with laparotomy/staging.  Prior endometrial biopsy negative for malignancy/hyperplasia.  Consented risk/benefit of EUA, laparoscopic right and/or left salpingo-oophorectomy; possible: laparotomy, dilation and curettage.  Plan for repeat CT and tumor markers, will submit surgical orders today.  RTC in 2 weeks for telemedicine to review imaging prior to surgery, all questions answered.   Gypsy Balsam, MD  Attending, Gynecologic Oncology

## 2022-03-20 ENCOUNTER — Telehealth: Payer: Self-pay

## 2022-03-20 DIAGNOSIS — N9489 Other specified conditions associated with female genital organs and menstrual cycle: Secondary | ICD-10-CM | POA: Insufficient documentation

## 2022-03-20 NOTE — Telephone Encounter (Signed)
Attempted to contact patient to schedule surgery, called home phone. Pt's daughter [place] answered, offered 05/19 as tentative surgery date. Pt's daughter agreed, will confirm with patient and will call back to confirm.    Aware ekg needed, and lab work, daughter requested ekg to be done here at [facility]. Explained Josem Kaufmann will be requested by insurance.    Pt daughter [place] verbalized understanding.

## 2022-03-20 NOTE — Telephone Encounter (Signed)
Called patient in attempt to schedule surgery, no answer. VML.

## 2022-03-22 NOTE — Anesthesia Preprocedure Evaluation (Addendum)
ANESTHESIA PRE-OPERATIVE EVALUATION    Patient Information    Name: Elizabeth White    MRN: [id number]    DOB: 1966/01/23    Age: 56 year old    Sex: female  Procedure(s):  T2 OBS Pelvic examination under anesthesia, diagnostic laparoscopy, right versus left salpingo-oophorectomy; possible: exploratory laparotomy, bilateral salpingo-oophorectomy, dilation and curettage  X      Pre-op Vitals:   See Vitals Flowsheets for details.        Primary language spoken:  English    ROS/Medical History:       History of Present Illness: 12F w/Adnexal mass         General:  negative for General ROS  Can do moderate to heavy activities ([facility] >= 4)-per psq 03/28/22    Social hx negative Cardiovascular:  negative cardio ROS  no hypertension,    EKG 04/03/22 epic: SR HR 61   Anesthesia History:    PSHx:  -1998:Tubal ligation (Bilateral) Pulmonary:   negative pulmonary ROS  no sleep apnea,  no recent URI/pneumonia,  Can safely lie flat on back for an hour-Yes; per psq 03/28/22   Neuro/Psych:   negative neuro/psych ROS  negative for TIA/CVA,  no seizures,   Hematology/Oncology:   anemia,      GI/Hepatic:  negative GI/hepatic ROS Infectious Disease:  negative for infectious disease     Renal:  negative renal ROS   Endocrine/Other:  no diabetes,  no history of thyroid disease,    -12/11/21: CTAP: 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 EMS 0.6 cm    03/19/22 Gyn/Onc note epic:  H/o a 7cm right complex adnexal mass and PMB; w/some interval growth in size of tumor.  Pt prefers to do interval surgeries (Dx lap for tumor removal, wait for results, then re-assess).    Pregnancy History:  Gravida: 3,  Para: 3,  LMP: Postmenopausal Pediatrics:    negative pediatric ROS  no premature birth,       Pre Anesthesia Testing (PCC/CPC) notes/comments:    [facility] Test & records reviewed by [facility]  Provider.                      Data reviewed from available information- A. Derrill Kay RN 03/22/2022 9:47 AM    Pending:  (x) psq  (x) labs: CMP,CBC; 12-lead ekg <6 months-per note 03/19/22 epic    CMP/CBC 04/03/22 epic: Labs unremarkable               Physical Exam    Airway:  Inter-inciser distance > 4 cm  Prognanth Able    Mallampati: I  Neck ROM: full  TM distance: > 6 cm          Cardiovascular:    Rhythm: regular         Pulmonary:       breath sounds clear to auscultation        Neuro/Neck/Skeletal/Skin:      Dental:    (+) implants      Abdominal:  Past Medical History:   Diagnosis Date   . Anemia      Past Surgical History:   Procedure Laterality Date   . TUBAL LIGATION Bilateral 1998     Social History     Socioeconomic History   . Marital status: Divorced   Tobacco Use   . Smoking status: Never   . Smokeless tobacco: Never   Substance and Sexual Activity   . Alcohol use: Never   . Drug use: Never       No current facility-administered medications for this encounter.     Current Outpatient Medications   Medication Sig Dispense Refill   . chlorhexidine (PERIDEX) 0.12 % solution RINSE TWICE DAILY BY MOUTH FOR 1 MINUTE     . methylPREDNISolone (MEDROL DOSEPACK) 4 MG tablet Take as directed on package 1 each 0     No Known Allergies    Labs and Other Data  Lab Results   Component Value Date    NA 142 04/03/2022    K 4.2 04/03/2022    CL 105 04/03/2022    BUN 19 04/03/2022    CREAT 0.99 04/03/2022    GLU 112 04/03/2022    [place] 9.4 04/03/2022     Lab Results   Component Value Date    AST 16 04/03/2022    ALT 14 04/03/2022    ALK 91 04/03/2022    TP 7.5 04/03/2022    ALB 4.3 04/03/2022    TBILI 0.6 04/03/2022     Lab Results   Component Value Date    RBC 4.68 04/03/2022    HGB 14.3 04/03/2022    HCT 41.1 04/03/2022    MCV 87.8 04/03/2022    MCHC 34.8 04/03/2022    RDW 12.0 04/03/2022    PLT 240 04/03/2022     No results found  for: INR, PTT  No results found for: PH, PO2, PCO2    Anesthesia Plan:  Risks and Benefits of Anesthesia  I personally examined the patient immediately prior to the anesthetic and reviewed the pertinent medical history, drug and allergy history, laboratory and imaging studies and consultations. I have determined that the patient has had adequate assessment and testing.    Anesthetic techniques, invasive monitors, anesthetic drugs for induction, maintenance and post-operative analgesia, risks and alternatives have been explained to the patient and/or patient's representatives.    I have prescribed the anesthetic plan:         Planned anesthesia method: General         ASA 1 (Healthy)     Potential anesthesia problems identified and risks including but not limited to the following were discussed with patient and/or patient's representative: Adverse or allergic drug reaction, Administration of blood products, Recall, Ocular injury, Dental injury or sore throat, Nerve injury, Injury to brain, heart and other organs and Death    No Beta Blocker Indicated: Patient not on beta blockersPlanned monitoring method: Routine monitoring  Comments: (Reviewed above H and P. Denies any changes including chest pain, sob, URI. NPO confirmed. Plan for GETA. Informed consent obtained and patient understands and agrees with plan. )    Informed Consent:  Anesthetic plan and risks discussed with Patient.    Plan discussed with OR Nurse and Surgeon.

## 2022-03-27 ENCOUNTER — Telehealth: Payer: Self-pay

## 2022-03-27 NOTE — Telephone Encounter (Signed)
Patient's daughter Jordan Hawks is returning call from [place]; call transferred

## 2022-03-28 NOTE — Telephone Encounter (Signed)
Spoke to patients daughter [place], made her aware that EKG has been approved to [facility], provided information to go to [facility]. patients daughter requested to r/s surgery. Will call back to confirm date.

## 2022-03-29 ENCOUNTER — Telehealth: Payer: Self-pay

## 2022-03-29 NOTE — Telephone Encounter (Signed)
Pt requesting to speak to Sx scheduler regarding clarification on Sx date, call transferred over to [place].

## 2022-03-29 NOTE — Telephone Encounter (Signed)
Spoke to patient requested to reschedule surgery confirmed she will not be available on 05/19.    Offered 06/20 at 7:15am patient ok with date and time. Aware to arrive 2hrs prior.  Aware to complete covid test, labs, ekg prior to sx.    Patient verbalized understanding.

## 2022-04-03 ENCOUNTER — Ambulatory Visit
Admission: RE | Admit: 2022-04-03 | Discharge: 2022-04-03 | Disposition: A | Discharge status: Home / Self Care | Payer: No Typology Code available for payment source

## 2022-04-03 DIAGNOSIS — Z01810 Encounter for preprocedural cardiovascular examination: Secondary | ICD-10-CM

## 2022-04-03 DIAGNOSIS — N9489 Other specified conditions associated with female genital organs and menstrual cycle: Secondary | ICD-10-CM | POA: Insufficient documentation

## 2022-04-04 LAB — COMPREHENSIVE METABOLIC PANEL, BLOOD
ALT (SGPT): 14 U/L (ref 6–29)
AST (SGOT): 16 U/L (ref 10–35)
Albumin/Glob Ratio: 1.3 (calc) (ref 1.0–2.5)
Albumin: 4.3 g/dL (ref 3.6–5.1)
Alkaline Phos: 91 U/L (ref 37–153)
BUN: 19 mg/dL (ref 7–25)
Bilirubin, Total: 0.6 mg/dL (ref 0.2–1.2)
Calcium: 9.4 mg/dL (ref 8.6–10.4)
Carbon Dioxide: 31 mmol/L (ref 20–32)
Chloride: 105 mmol/L (ref 98–110)
Creatinine: 0.99 mg/dL (ref 0.50–1.03)
EGFR: 68 mL/min/{1.73_m2} (ref >=60)
Globulin: 3.2 g/dL (calc) (ref 1.9–3.7)
Glucose: 112 mg/dL (ref 65–139)
Potassium: 4.2 mmol/L (ref 3.5–5.3)
Sodium: 142 mmol/L (ref 135–146)
Total Protein: 7.5 g/dL (ref 6.1–8.1)

## 2022-04-04 LAB — HEMOGRAM, BLOOD
HCT: 41.1 % (ref 35.0–45.0)
HGB: 14.3 g/dL (ref 11.7–15.5)
MCH: 30.6 pg (ref 27.0–33.0)
MCHC: 34.8 g/dL (ref 32.0–36.0)
MCV: 87.8 fL (ref 80.0–100.0)
MPV: 9.3 fL (ref 7.5–12.5)
PLT: 240 10*3/uL (ref 140–400)
RBC: 4.68 10*6/uL (ref 3.80–5.10)
RDW: 12 % (ref 11.0–15.0)
WBC: 4.8 10*3/uL (ref 3.8–10.8)

## 2022-04-04 LAB — CEA TUMOR, BLOOD: CEA: <2 ng/mL

## 2022-04-04 LAB — ~~LOC~~ 19-9, BLOOD: ~~LOC~~ 19-9: <3 U/mL (ref <34)

## 2022-04-04 LAB — CANCER CA125, BLOOD: ~~LOC~~ 125: 26 U/mL (ref <35)

## 2022-04-05 DIAGNOSIS — N9489 Other specified conditions associated with female genital organs and menstrual cycle: Secondary | ICD-10-CM

## 2022-04-07 LAB — ECG 12-LEAD
P AXIS: 81 Deg
PR INTERVAL: 146 ms
QRS INTERVAL/DURATION: 81 ms
QT: 404 ms
QTc (Bazett): 407 ms
R AXIS: 84 Deg
R-R INTERVAL AVERAGE: 975 ms
T AXIS: 60 Deg
VENTRICULAR RATE: 61 {beats}/min

## 2022-05-01 ENCOUNTER — Telehealth: Payer: Self-pay

## 2022-05-01 NOTE — Telephone Encounter (Signed)
Late entry: spoke to patient yesterday and to daughter [place] offered to move up sx on 06/19. Patient declined states they have everything planned for 06/20.     Surgery is scheduled as planned 06/20 recapped on sx details. Aware to check in 2hrs prior.   Aware to complete covid test 48/24hrs prior to sx.     Pt requesting call to go over procedure. Msg sent to medical staff, PA Apolonio Schneiders for assistance.      Pt verbalized understanding.

## 2022-05-01 NOTE — H&P (Signed)
[place]  Pre-Operative History    CC: 7cm complex right adnexal mass    History of Present Illness:  **Obtained from last office visit on 03/19/2022**    Elizabeth White is a 56 year old G3P3 with a history of a 7cm right complex adnexal mass and PMB, presenting for follow-up. Please see below for full details of her oncology history.    Patient was last seen via [place] on 01/29/2022, at which time had no new complaints. The plan was to refer to Gynecology for surveillance of adnexal mass given patient declined surgery at that time.    Ca125 01/02/22: 25  CEA 01/02/22: <2  Ca19-9 01/02/22: <3    On today's visit, patient is doing overall well. She would prefer to avoid surgery if possible however will do whatever we think is best.     Interview conducted in: English    Oncologic History:   03/27/2021 CT A/P w/o Con: A fat containing right adnexal lesion measuring 5.4 x 5.5 x 2.4 cm most consistent with dermoid cyst. The uterus is retroverted with thickening of the endometrial canal.  05/2021: Roberts-125 24  10/05/2021 - TVUS: Retroverted 9.7 cm fibroid uterus with a R adnexal complex echogenic solid components with low level echoes 6.7 x 3.6 x 6.3 cm with no color flow seen. Two left simplex ovarian cysts 1.4 and 1.5 cm. Endometrium 1.4 cm. No pelvic fluid.  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 Nonvisualization of the left ovary. No other abnormality is noted within the pelvis.    Gynecologic History:  OB History   Gravida Para Term Preterm AB Living   '3 3 3 '$ 0 0 3   SAB IAB Ectopic Multiple Live Births    0 0 0 0 3   Obstetric Comments   SVD x3      Gyn History     LMP: Postmenopausal    Age at Menarche: 32    Age at First Pregnancy: 44    Age at Menopause:     Gyn History Comments:     Sexual Activity: No sexual activity data on record; No partner data on record    Contraception: No contraception data on record          Past Medical History:  Past Medical History:   Diagnosis Date   . Anemia         Past Surgical History:  Past Surgical History:   Procedure Laterality Date   . TUBAL LIGATION Bilateral 1998       Family History:   Family History   Problem Relation Name Age of Onset   . Colorectal Cancer Sister         Social History:   Social History     Socioeconomic History   . Marital status: Divorced   Tobacco Use   . Smoking status: Never   . Smokeless tobacco: Never   Substance and Sexual Activity   . Alcohol use: Never   . Drug use: Never       Medications:   No current facility-administered medications for this encounter.  Current Outpatient Medications   Medication Sig   . chlorhexidine (PERIDEX) 0.12 % solution RINSE TWICE DAILY BY MOUTH FOR 1 MINUTE   . methylPREDNISolone (MEDROL DOSEPACK) 4 MG tablet Take as directed on package       Allergies:   No Known Allergies    Review of Systems:  14-point ROS was performed.  Pertinent positives and negatives as in HPI.    Objective:  A full physical exam was performed at the patient's preoperative visit and will be re-performed on the day of surgery.    Labs:  Hospital Outpatient Visit on 04/03/2022   Component Date Value Ref Range Status   . ECG INTERPRETATION 04/03/2022 SINUS RHYTHM  Reviewed By Lucile Crater, MD  04/07/2022 2:18:59 PM   Final   . VENTRICULAR RATE 04/03/2022 61  BPM Final   . PR INTERVAL 04/03/2022 146  ms Final   . QRS INTERVAL/DURATION 04/03/2022 81  ms Final   . QT 04/03/2022 404  ms Final   . QTC INTERVAL 04/03/2022 407  ms Final   . P AXIS 04/03/2022 81  Deg Final   . R AXIS 04/03/2022 84  Deg Final   . T AXIS 04/03/2022 60  Deg Final    . R-R INTERVAL AVERAGE 04/03/2022 975  ms Final        Imaging:  No results found.    Pathology:   FINAL DIAGNOSIS AFTER MICROSCOPY:     ENDOMETRIUM, BIOPSY:    -   Mucous and few benign weakly proliferative endometrium.   -   Negative for hyperplasia or malignancy.     Assessment: **Obtained from last office visit on 03/19/2022**    Elizabeth White is a 56 year old G3P3 with a history of a 7cm right complex adnexal mass and PMB, presenting for follow-up.     Some interval growth in size of tumor, patient now desires surgery. We explained to her that we could send the tumor for frozen section during the surgery and potentially would perform a more extensive surgery. Patient declined and instead prefers to do interval surgeries (Dx lap for tumor removal, wait for results, then re-assess).     Plan:   - Repeat CT, tumor markers  - Telehealth in 2 weeks to review images  - Plan for surgery as described below    # Consents signed, pre-op orders placed  # Preop Labs:COVID,CBC,BMP, T&S, bHCG on DOS, Ca125, CEA, Ca199  # ZOX:[id number]  # BJY:[id number]  # Preoperative Imaging: CT abd/pelvis  # Medical optimization and risk stratification:notindicated  # Cardiac optimization and risk stratification:notindicated    PROCEDURE: Pelvic examination under anesthesia, diagnostic laparoscopy, right versus left salpingo-oophorectomy    POSSIBLE: exploratory laparotomy, bilateral salpingo-oophorectomy, dilation and curettage     Consent and questions were answered during the patient's preoperative appointment.     Patient was seen be the attending physician during the preoperative appointment and will be seen again on day of surgery.     For questions or concerns, please contact the Gynecologic Oncology pager [phone]. DO NOT page the physician directly for an immediate response as this will likely delay a response given the pager is covered by multiple providers.      This pre-operative history was  prepared by:   Merlyn Lot, MS4  [facility]

## 2022-05-02 ENCOUNTER — Telehealth: Payer: Self-pay | Admitting: Physician Assistant

## 2022-05-02 NOTE — Telephone Encounter (Signed)
Called and spoke to patient's daughter Jordan Hawks). She states that she was not able to attending the last clinic visit and that her mom has been giving her conflicting information. Clarified imaging results, surgical plan, and post-op recovery with daughter. Opportunity provided for questions and answers to daughter's voiced satisfaction.     Evette Cristal, PA-C  GYN Oncology  p: [phone]

## 2022-05-02 NOTE — Telephone Encounter (Signed)
Called and spoke to patient. She was not sure what questions her daughter had but gave me permission to speak with her daughter.     Called pt's daughter [place]. She stated that she was about to go to a meeting and was unable to speak. I will try her back later.     Evette Cristal, PA-C  GYN Oncology  p: [phone]

## 2022-05-03 ENCOUNTER — Telehealth: Payer: Self-pay

## 2022-05-03 NOTE — Telephone Encounter (Signed)
Patient notified EDD form completed and mailed out today. A copy of the form was sent to file. Resolved.

## 2022-05-07 ENCOUNTER — Observation Stay
Admission: RE | Admit: 2022-05-07 | Discharge: 2022-05-08 | Disposition: A | Discharge status: Home / Self Care | Payer: No Typology Code available for payment source

## 2022-05-07 ENCOUNTER — Ambulatory Visit (HOSPITAL_BASED_OUTPATIENT_CLINIC_OR_DEPARTMENT_OTHER): Payer: No Typology Code available for payment source

## 2022-05-07 ENCOUNTER — Ambulatory Visit: Payer: No Typology Code available for payment source

## 2022-05-07 ENCOUNTER — Encounter: Admission: RE | Disposition: A | Discharge status: Home Health | Payer: Self-pay

## 2022-05-07 DIAGNOSIS — N9489 Other specified conditions associated with female genital organs and menstrual cycle: Secondary | ICD-10-CM

## 2022-05-07 DIAGNOSIS — N83291 Other ovarian cyst, right side: Secondary | ICD-10-CM

## 2022-05-07 DIAGNOSIS — Z7952 Long term (current) use of systemic steroids: Secondary | ICD-10-CM | POA: Insufficient documentation

## 2022-05-07 DIAGNOSIS — D649 Anemia, unspecified: Secondary | ICD-10-CM | POA: Insufficient documentation

## 2022-05-07 DIAGNOSIS — R1900 Intra-abdominal and pelvic swelling, mass and lump, unspecified site: Secondary | ICD-10-CM | POA: Diagnosis present

## 2022-05-07 DIAGNOSIS — D763 Other histiocytosis syndromes: Secondary | ICD-10-CM

## 2022-05-07 DIAGNOSIS — Z800 Family history of malignant neoplasm of digestive organs: Secondary | ICD-10-CM | POA: Insufficient documentation

## 2022-05-07 DIAGNOSIS — N939 Abnormal uterine and vaginal bleeding, unspecified: Secondary | ICD-10-CM

## 2022-05-07 DIAGNOSIS — N83511 Torsion of right ovary and ovarian pedicle: Principal | ICD-10-CM | POA: Insufficient documentation

## 2022-05-07 LAB — ABO/RH (D) RECHECK - LAB USE ONLY: ABO/Rh(D): B POS

## 2022-05-07 LAB — BHCG (ISTAT) POC (LIO): Beta-hCG (iSTAT): <5 m[IU]/mL (ref <5)

## 2022-05-07 LAB — BASIC METABOLIC PANEL, BLOOD
BUN: 9 mg/dL (ref 7–25)
CO2: 24 mmol/L (ref 21–31)
Calcium: 7.9 mg/dL — ABNORMAL LOW (ref 8.6–10.3)
Chloride: 102 mmol/L (ref 98–107)
Creat: 0.6 mg/dL (ref 0.6–1.2)
Electrolyte Balance: 9 mmol/L (ref 2–12)
Glucose: 120 mg/dL (ref 85–125)
Potassium: 4.1 mmol/L (ref 3.5–5.1)
Sodium: 135 mmol/L — ABNORMAL LOW (ref 136–145)
eGFR - high estimate: >60 (ref >59)
eGFR - low estimate: >60 (ref >59)

## 2022-05-07 LAB — TYPE, SCREEN + HOLD (PRETRANSFUSION TESTING)
ABO/Rh(D): B POS
Antibody Screen Result: NEGATIVE
Units Ordered: 2

## 2022-05-07 LAB — HEMOGRAM, BLOOD
Hematocrit: 37.9 % (ref 34.0–44.0)
Hgb: 12.8 G/DL (ref 11.5–15.0)
MCH: 30 PG (ref 27.0–33.5)
MCHC: 33.9 G/DL (ref 32.0–35.5)
MCV: 88.7 FL (ref 81.5–97.0)
MPV: 6.8 FL — ABNORMAL LOW (ref 7.2–11.7)
PLT Count: 187 10*3/uL (ref 150–400)
RBC: 4.27 10*6/uL (ref 3.70–5.00)
RDW-CV: 12.5 % (ref 11.6–14.4)
White Bld Cell Count: 7.8 10*3/uL (ref 4.0–10.5)

## 2022-05-07 SURGERY — SALPINGO-OOPHORECTOMY, LAPAROSCOPIC
Anesthesia: General | Wound class: Class II (Clean Contaminated)

## 2022-05-07 MED ORDER — SURGILUBE EX GEL
CUTANEOUS | Status: AC
Start: 2022-05-07 — End: ?
  Filled 2022-05-07: qty 56.7

## 2022-05-07 MED ORDER — PROPOFOL 200 MG/20ML IV EMUL
INTRAVENOUS | Status: AC
Start: 2022-05-07 — End: ?
  Filled 2022-05-07: qty 20

## 2022-05-07 MED ORDER — FENTANYL CITRATE (PF) 100 MCG/2ML IJ SOLN
INTRAMUSCULAR | Status: DC | PRN
Start: 2022-05-07 — End: 2022-05-07
  Administered 2022-05-07 (×2): 50 ug via INTRAVENOUS

## 2022-05-07 MED ORDER — SURGIFLO HEMOSTATIC MATRIX KIT
PACK | CUTANEOUS | Status: AC
Start: 2022-05-07 — End: ?
  Filled 2022-05-07: qty 2

## 2022-05-07 MED ORDER — HYDROMORPHONE HCL 1 MG/ML IJ SOLN
0.2000 mg | INTRAMUSCULAR | Status: DC | PRN
Start: 2022-05-07 — End: 2022-05-08

## 2022-05-07 MED ORDER — PLASMA-LYTE A IV SOLN
10.0000 mL/h | INTRAVENOUS | Status: DC
Start: 2022-05-07 — End: 2022-05-08
  Administered 2022-05-07: 10 mL/h via INTRAVENOUS

## 2022-05-07 MED ORDER — MIDAZOLAM HCL (PF) 2 MG/2ML IJ SOLN
INTRAMUSCULAR | Status: AC
Start: 2022-05-07 — End: ?
  Filled 2022-05-07: qty 2

## 2022-05-07 MED ORDER — HYDROMORPHONE HCL 1 MG/ML IJ SOLN
0.4000 mg | INTRAMUSCULAR | Status: DC | PRN
Start: 2022-05-07 — End: 2022-05-08
  Administered 2022-05-07 (×2): 0.4 mg via INTRAVENOUS
  Filled 2022-05-07 (×2): qty 0.5

## 2022-05-07 MED ORDER — HYDROCODONE-ACETAMINOPHEN 5-325 MG OR TABS
1.0000 | ORAL_TABLET | ORAL | Status: DC | PRN
Start: 2022-05-07 — End: 2022-05-08

## 2022-05-07 MED ORDER — SENNA 8.6 MG OR TABS
8.6000 mg | ORAL_TABLET | Freq: Every day | ORAL | 0 refills | Status: AC
Start: 2022-05-07 — End: ?

## 2022-05-07 MED ORDER — ACETAMINOPHEN 325 MG PO TABS
650.0000 mg | ORAL_TABLET | Freq: Four times a day (QID) | ORAL | 0 refills | Status: DC | PRN
Start: 2022-05-07 — End: 2022-09-03

## 2022-05-07 MED ORDER — ONDANSETRON HCL 4 MG/2ML IV SOLN
4.0000 mg | Freq: Four times a day (QID) | INTRAMUSCULAR | Status: DC | PRN
Start: 2022-05-07 — End: 2022-05-08

## 2022-05-07 MED ORDER — ACETAMINOPHEN 500 MG OR TABS
500.0000 mg | ORAL_TABLET | Freq: Once | ORAL | Status: AC
Start: 2022-05-07 — End: 2022-05-07
  Administered 2022-05-07: 500 mg via ORAL
  Filled 2022-05-07: qty 1

## 2022-05-07 MED ORDER — SUCCINYLCHOLINE CHLORIDE 100 MG/5ML IV SOSY
PREFILLED_SYRINGE | INTRAVENOUS | Status: DC | PRN
Start: 2022-05-07 — End: 2022-05-07

## 2022-05-07 MED ORDER — LIDOCAINE 1% SOLN OPTIME (~~LOC~~)
INTRAMUSCULAR | Status: DC | PRN
Start: 2022-05-07 — End: 2022-05-07
  Administered 2022-05-07: 50 mg via INTRAVENOUS

## 2022-05-07 MED ORDER — HEPARIN SODIUM (PORCINE) 1000 UNIT/ML IJ SOLN (CUSTOM)
INTRAMUSCULAR | Status: AC
Start: 2022-05-07 — End: ?
  Filled 2022-05-07: qty 6

## 2022-05-07 MED ORDER — OXYCODONE HCL 5 MG OR TABS
5.0000 mg | ORAL_TABLET | Freq: Four times a day (QID) | ORAL | 0 refills | Status: DC | PRN
Start: 2022-05-07 — End: 2022-05-23

## 2022-05-07 MED ORDER — ROCURONIUM BROMIDE 100 MG/10ML IV SOLN
INTRAVENOUS | Status: DC | PRN
Start: 2022-05-07 — End: 2022-05-07
  Administered 2022-05-07: 50 mg via INTRAVENOUS

## 2022-05-07 MED ORDER — SODIUM CHLORIDE 0.9 % IV SOLN
12.5000 mg | Freq: Once | INTRAVENOUS | Status: DC | PRN
Start: 2022-05-07 — End: 2022-05-08

## 2022-05-07 MED ORDER — CEFAZOLIN SODIUM 1 GM IJ SOLR
INTRAMUSCULAR | Status: DC | PRN
Start: 2022-05-07 — End: 2022-05-07
  Administered 2022-05-07: 2000 mg via INTRAVENOUS

## 2022-05-07 MED ORDER — NEOSTIGMINE METHYLSULFATE 5 MG/5ML IV SOSY
PREFILLED_SYRINGE | INTRAVENOUS | Status: DC | PRN
Start: 2022-05-07 — End: 2022-05-07
  Administered 2022-05-07: 2.5 mg via INTRAVENOUS

## 2022-05-07 MED ORDER — FENTANYL CITRATE (PF) 100 MCG/2ML IJ SOLN
INTRAMUSCULAR | Status: AC
Start: 2022-05-07 — End: ?
  Filled 2022-05-07: qty 2

## 2022-05-07 MED ORDER — GLYCOPYRROLATE 1 MG/5ML IJ SOLN
INTRAMUSCULAR | Status: DC | PRN
Start: 2022-05-07 — End: 2022-05-07
  Administered 2022-05-07: .5 mg via INTRAVENOUS

## 2022-05-07 MED ORDER — PROPOFOL 200 MG/20ML IV EMUL
INTRAVENOUS | Status: DC | PRN
Start: 2022-05-07 — End: 2022-05-07
  Administered 2022-05-07 (×4): 10 mg via INTRAVENOUS
  Administered 2022-05-07: 150 mg via INTRAVENOUS
  Administered 2022-05-07: 10 mg via INTRAVENOUS

## 2022-05-07 MED ORDER — IBUPROFEN 600 MG OR TABS
600.0000 mg | ORAL_TABLET | Freq: Four times a day (QID) | ORAL | 0 refills | Status: DC | PRN
Start: 2022-05-07 — End: 2022-09-03

## 2022-05-07 MED ORDER — MECLIZINE HCL 12.5 MG OR TABS
12.5000 mg | ORAL_TABLET | Freq: Three times a day (TID) | ORAL | Status: DC | PRN
Start: 2022-05-07 — End: 2022-05-08
  Administered 2022-05-07: 12.5 mg via ORAL
  Filled 2022-05-07 (×2): qty 1

## 2022-05-07 MED ORDER — OXYCODONE HCL 5 MG OR TABS
5.0000 mg | ORAL_TABLET | ORAL | Status: DC | PRN
Start: 2022-05-07 — End: 2022-05-08

## 2022-05-07 MED ORDER — EPHEDRINE SULFATE (PRESSORS) 50 MG/ML IJ SOLN
INTRAMUSCULAR | Status: DC | PRN
Start: 2022-05-07 — End: 2022-05-07
  Administered 2022-05-07 (×2): 5 mg via INTRAVENOUS

## 2022-05-07 MED ORDER — SILVER NITRATE-POT NITRATE 75-25 % EX MISC
CUTANEOUS | Status: AC
Start: 2022-05-07 — End: ?
  Filled 2022-05-07: qty 1

## 2022-05-07 MED ORDER — BUPIVACAINE HCL (PF) 0.25 % IJ SOLN
INTRAMUSCULAR | Status: AC
Start: 2022-05-07 — End: ?
  Filled 2022-05-07: qty 30

## 2022-05-07 MED ORDER — NALOXONE HCL 0.4 MG/ML IJ SOLN
0.2000 mg | Freq: Once | INTRAMUSCULAR | Status: DC | PRN
Start: 2022-05-07 — End: 2022-05-08

## 2022-05-07 MED ORDER — OXYCODONE HCL 5 MG OR TABS
10.0000 mg | ORAL_TABLET | ORAL | Status: DC | PRN
Start: 2022-05-07 — End: 2022-05-08

## 2022-05-07 MED ORDER — MIDAZOLAM HCL 2 MG/2ML IJ SOLN
INTRAMUSCULAR | Status: DC | PRN
Start: 2022-05-07 — End: 2022-05-07
  Administered 2022-05-07: 2 mg via INTRAVENOUS

## 2022-05-07 MED ORDER — DEXAMETHASONE SODIUM PHOSPHATE 4 MG/ML IJ SOLN (CUSTOM)
INTRAMUSCULAR | Status: DC | PRN
Start: 2022-05-07 — End: 2022-05-07
  Administered 2022-05-07: 4 mg via INTRAVENOUS

## 2022-05-07 MED ORDER — EPHEDRINE SULFATE (PRESSORS) 5 MG/ML IV SOLN
INTRAVENOUS | Status: AC
Start: 2022-05-07 — End: ?
  Filled 2022-05-07: qty 10

## 2022-05-07 MED ORDER — PLASMA-LYTE A IV SOLN
INTRAVENOUS | Status: DC
Start: 2022-05-07 — End: 2022-05-08

## 2022-05-07 SURGICAL SUPPLY — 45 items
Bag Tissue Retrieval Endoscopic 300ml 12mm Anchor (Misc Medical Supply) ×2 IMPLANT
Blanket Upper Body Multiposition Bair Hugger (Misc Medical Supply) ×2 IMPLANT
CONT SPECIMEN OR WRAP 120ML (Misc Medical Supply) ×8 IMPLANT
DRAPEIOBAN60X45CM6650EZ (Drapes/towels) ×2 IMPLANT
DRESSING - SMALL TEGADERM 2 X 2 (Dressings/packing) ×6 IMPLANT
DRESSING LRG.TEGADERM 4X10 (Dressings/packing) ×4 IMPLANT
GLOVES BIOGEL 6.0 LTX (Gloves/Gowns) ×8 IMPLANT
GLOVES BIOGEL 6.5 LTX (Gloves/Gowns) ×4 IMPLANT
GLOVES BIOGEL 7.0 LTX (Gloves/Gowns) ×2 IMPLANT
GLOVES BIOGEL 7.5 LTX (Gloves/Gowns) ×2 IMPLANT
GLOVES INDICATOR 6.0 UG LTX PF GRN (Gloves/Gowns) ×2 IMPLANT
GLOVES INDICATOR 6.5 UG LTX PF GRN (Gloves/Gowns) ×6 IMPLANT
GLOVES INDICATOR 7.5 UG LTX PF GRN (Gloves/Gowns) ×4 IMPLANT
GLOVES SKINSSENSE PI 7.0 LF PF (Gloves/Gowns) ×8 IMPLANT
GOWN SURGICAL LARGE NON-REINFORCED RAGLAN SLEEVE AURORA STERILE (Gloves/Gowns) ×4 IMPLANT
GOWN SURGICAL XL NON-REINFORCED RAGLAN SLEEVE AURORA STERILE (Gloves/Gowns) ×6 IMPLANT
Garment Compression Calf Regular Reprocessed  Up to 18in SCD Nonsterile (Dressings/packing) ×2 IMPLANT
HEMOCLIP MED TI LIGATING CLIPS TELEFLEX MED (Misc Surgical Supply) ×8 IMPLANT
LOOPS VESSEL SILC MAXI BLUE 2 FTS 1001-78 (Misc Surgical Supply) IMPLANT
MAGNETIC INSTRUMENT MAT (Drapes/towels) ×2 IMPLANT
MANIFOLD SCT NPTN 2 2 STD 4 PORT WST (Reusable instruments/equipment) ×2 IMPLANT
Manipulator Uterine Medium 34mm Cervical Cup VCarre Plus (Lap/Endo/Arthroscopy) ×1 IMPLANT
PACK UNIVERSAL, NO GOWNS (Drapes/towels) ×2 IMPLANT
PAD GROUNDING ABC REM DUAL (Cranial electrodes/grids) IMPLANT
Pack Surgical Gyn Laparoscopy New Custom (Procedure Packs/kits) ×2 IMPLANT
Pack Surgical Standard Closure Intermediate Custom (Procedure Packs/kits) IMPLANT
Pad Nonadherent 3x8in Curad Sterile (Misc Medical Supply) ×2 IMPLANT
Passer Suture 14G f/Trocar Site Closure (Misc Surgical Supply) ×2 IMPLANT
RAPIDVAC FILTER 3 PORT SMOKE EVAC GUARD TUBING (Tubing/Suction) IMPLANT
RETRACTOR/ELEVATOR LAPSCP 34MM VCARE + CUP CRV UT STRL LF DISP MED (Lap/Endo/Arthroscopy) ×2
SEALER/DIVIDER LAPAROSCOPIC RETRACTABLE L-HOOK 44CM LIGASURE (Hemostatic agents/wax/sealants-absorbable) ×2 IMPLANT
SOL SCRUB 4 PCT 4OZ ENDURE (Non-Pharmacy Meds/Solutions) ×2 IMPLANT
STERISTRIPS1/2"X4"WHITE (Misc Medical Supply) ×2 IMPLANT
Scissors Surgical 5mm/35cm Blue Fully Disp SurgyCut (Lap/Endo/Arthroscopy) IMPLANT
Sponge Gauze 2x2in 4-Ply Non-woven 2pk Sterile (Dressings/packing) ×4 IMPLANT
Sterile Mucus Specimen Trap with Transport Cap 40cc Capacity (Misc Surgical Supply) ×2 IMPLANT
Suture Biosyn 4-0 P-12 75cm Undyed (Suture) ×4 IMPLANT
Suture Polysorb 0 GU-46 75cm Violet (Suture) ×6 IMPLANT
Suture Polysorb 0 V-20 Taperpoint 75cm Undyed (Suture) ×2 IMPLANT
TIP CAUT BLADE 6" CTD SILC (Cranial electrodes/grids) ×2 IMPLANT
TOWELS, STERILE BLUE (Drapes/towels) ×2 IMPLANT
TRENGUARD 450 PADS (Misc Surgical Supply) IMPLANT
TROCAR LAPSCP 12MM VPRT+ STD OPTC BLDLS FX CNN DLPHN NOSE TIP (Lap/Endo/Arthroscopy) ×2 IMPLANT
Tray Foley Catheter 14fr 10ml Snapsecure w/Urine Meter Latex (Procedural wires/sheaths/catheters/balloons/dilators) ×2 IMPLANT
VAGINAL PACK (NEW) (Procedure Packs/kits) IMPLANT

## 2022-05-07 NOTE — RN OR/Procedure Note (Signed)
Pt stated she did a Covid tests x 2 in the last 48 hours and recent one was yesterday and were both negative.

## 2022-05-07 NOTE — Event / Update (Signed)
POST DISCHARGE FOLLOW-UP    APPOINTMENT HAS BEEN SCHEDULED WITH DR. DIANE NGUYEN ON 05/09/2022 @ 11:15AM. [facility] [place] [id number] T: [phone] F: [phone]

## 2022-05-07 NOTE — Op Note (Addendum)
OPERATIVE NOTE    DATE: 05/07/2022  TIME: 9:10 AM    PREOPERATIVE DIAGNOSIS:   Complex adnexal mass     POSTOPERATIVE DIAGNOSIS:   Right ovarian torsion     PROCEDURE INFORMATION:  Pelvic exam under anesthesia, laparoscopic right salpingo-oophorectomy, left salpingectomy    ATTENDING SURGEON:   Surgeon(s) and Role:     Wilfred Curtis, Gelene Mink, MD - Primary     * Cathie Beams, MD - Resident - Assisting     * Cornelius-Schecter, Vicente Males, MD - Resident - Assisting    ANESTHESIA: GETA    INDICATIONS: This is a 56 yo P3 with history of complex adnexal mass, normal tumor markers and post-menopausal bleeding.  The patient previously underwent MRI pelvis and transvaginal ultrasound with a right adnexal 6 x 6cm adnexal mass with solid and fatty components, an endometrial thickness of 0.6cm was noted.  [place]-125 19-9, and CEA (12/2011) lab values were noted.  The patient underwent endometrial biopsy  (12/2011) with weakly proliferative endometrium without hyperplasia or malignancy.  Given the size and abnormal features the patient was recommend surgical removal, however she strongly declined, the patient was then seen in follow-up 03/2022 where she was interested in surgical removal.  The risks, benefits and alternatives were extensively discussed with the patient including pain, infection, bleeding, damage to surrounding structures (bowel, bladder, nerves, blood vessels) that may require additional procedures or surgery.  The patient strongly desired conservative management of the uterus and remaining ovary and tube and wanted to defer any additional surgery to permanent pathology report.     FINDINGS: Normal external female genitalia. Cervix and vagina without lesions or discharge. Uterus slightly enlarged, mobile. Left ovary with small simple cyst, otherwise unremarkable. Bilateral fallopian tubes without pathology however multiple small cysts noted. Right ovary with ~8 cm with sebaceous cystic fluid. Anterior and posterior cul de  sac without gross pathology. No significant gross pathology in the upper abdomen, normal appendix, liver edge and bilateral diaphragm surfaces     SPECIMENS:   ID Type Source Tests Collected by Time Destination   1 : pelvic washings Washings Pelvic CYTOLOGY NON GYN - [place] Roland Earl, MD 05/07/2022 [phone]    2 : left fallopian tube Tissue Fallopian Tube, Left PATHOLOGY TISSUE EXAM - Geroge Baseman, MD 05/07/2022 [phone]    3 : Right fallopian tube and ovary Tissue Ovary + Tube, Right PATHOLOGY TISSUE EXAM [address], Bailey Mech H, MD 05/07/2022 0848      Fluids/Blood Products:      IV Fluids: 1100 cc    Blood Products: none    EBL: 10 cc    Urine Output: 976 cc    COMPLICATIONS: none    PROCEDURE IN DETAIL:   The patient was brought to the operating room with her intravenous fluids running.  She received antibiotics for prophylactic purposes.  Sequential compression devices were applied to the lower extremities and activated.  She was placed in the supine position where general endotracheal anesthesia was obtained without difficulty.  She was prepared and draped in standard sterile manner in the low lithotomy position in [place].  A Foley catheter was placed to drain the bladder and an oral gastric tube was placed.     It was confirmed that the patient's bed was flat. A Veress needle was introduced at [facility] and intraperitoneal entry was confirmed as sterile saline drained easily through the needle. The abdomen was insufflated with CO2 with an opening pressure  of 4 mmHg. The abdomen was insufflated to a total of 15 mmHg. The camera was then introduced. A medium V-care was placed within the uterine cavity to allow for uterine manipulation.  The abdomen and pelvis were evaluated with the findings as documented with no evidence of visceral or vascular injury appreciated. Trocars were then introduced in the right lower quadrant with a 5 mm port and in the umbilicus with a 12 mm port.     Using a laparoscopic  grasper, the right ovary was tented up at the inferior pelvic ligament and ureter was identified transperitoneally. The LigaSure was used to cauterize and ligate the IP ligament with with excellent visualization of the ureter. The fallopian tube was then ligated at the uterine corpus. After fully excising the right ovary and fallopian tube, they were placed in an Endocatch bag and removed through the 33m port intact. Attention was then turned to the left adnexa and a left salpingectomy was performed. The left ovary was not removed as it overall appeared normal.The left fallopian tube was removed from the abdomen. Prior to desufflation of the abdomen, the surgical sites were confirmed to be hemostatic.     The fascia at the 144mport site was closed with 0 Vicryl using the [employer]. The abdomen was desufflated and all instruments were removed.  The V-care was removed without complication and hemostasis confirmed. The skin was closed with 4-0 Monocryl. The foley was removed at the conclusion of the case.    DISPOSITION: PACU in stable condition    PrHarlon FlorD  PGY-4, Obstetrics and Gynecology    For questions or concerns, please contact the Gynecology pager [phone] than contacting the physician directly for an immediate response.     Agree with note as edited above.   KiGypsy BalsamMD  Attending, Gynecologic Oncology

## 2022-05-07 NOTE — RN OR/Procedure Note (Signed)
Per Dr.Clair, to disregard the ordered for repeat labs this morning. Resident OB was paged.

## 2022-05-07 NOTE — H&P (Signed)
HISTORY & PHYSICAL - INTERVAL ASSESSMENT & UPDATE     Elizabeth White        MRN: [id number]    I have reviewed the current history and physical (which is less than 72 days old) and verified its content. I have reexamined the patient today. There are no significant changes to medical status, history or physical examinations in the 24 hours prior to the procedure.  Pre-operative antibiotics ordered as appropriate for organ system/body area of surgery.    Reviewed plan of care with patient bedside, plan for pelvic EUA, diagnostic laparoscopy, right and/or left salpingo-oophorectomy; possible: ex-lap  Patient would like only abnormal ovary to be removed, understands risk of laparotomy and biopsies as indicated    Roland Earl, MD       05/07/22  7:06 AM

## 2022-05-07 NOTE — Plan of Care (Signed)
Problem: Promotion of Perioperative Health and Safety  Goal: Promotion of Health and Safety of the Perioperative Patient  Description: The patient remains safe, receives treatment appropriate to the surgical intervention and patient's physiological needs and is discharged or transferred to the appropriate level of care.  Outcome: Progressing  Flowsheets (Taken 05/07/2022 0529)  Standard of Care: Pre-Op [facility]

## 2022-05-07 NOTE — Anesthesia Procedure Notes (Signed)
Intubation    Date/Time: 05/07/2022 8:07 AM    Performed by: Clovis Pu  Authorized by: Clovis Pu    Procedure Details:     Pre-oxygenated: Yes      Induction:  IV    Mask Ventilation:  Easy    Blade:  Mac 3    Blade (2nd attempt):     View Grade:  2    View Grade (2nd attempt):     Tube type:  Oral cuffed    Tube type (2nd attempt):     Tube size (mm):  6.5    Tube size (mm) (2nd attempt):     Cricoid pressure: Yes      Stylet: Yes      Bougie Used: No      Dental damage: No      Lip/Other damage: No      Bite block inserted:  Oral airway    ET-CO2 present: Yes      Breath sounds equal: Yes      Eyes taped: Yes    Patient has difficult airway?  No

## 2022-05-07 NOTE — Discharge Instructions (Signed)
CARE AFTER LAPAROSCOPIC SURGERY    Your Appointments      Thursday May 23, 2022  2:20 PM  POST OP with Roland Earl, MD  [facility] [place] ([place] [place] [address]  [phone]        Tuesday June 18, 2022  2:20 PM  POST OP with Roland Earl, MD  [facility] [place] ([place]) [place] [address]  [phone]             Call the clinic if you have any questions or unexpected symptoms. If you have any of the symptoms below or another a problem that you feel is an emergency and the clinic is closed, please call the [facility] operator at [phone] and ask for the Gynecologic Oncology Fellow on call:   develop a fever over 100.34F (38C)  start bleeding like a menstrual period and/or are changing a pad every hour  have heavy vaginal discharge with a bad odor  have nausea and vomiting, and you can't tolerate liquids or small bites of food  have chest pain or difficulty breathing  develop swelling, redness, or pain in your legs  develop a rash  have pain with urination     Activities: With a minimally invasive laparoscopic procedure, you will recover much more quickly than with a standard open procedure. Your activity level should be gradually increased over the next 2 to 7 days depending on the extent of your procedure. It is normal to tire easily after surgery and you should maintain a flexible schedule and get adequate rest. When you go home you may:  Return to your regular diet as tolerated  Walk frequently and increase your activity gradually each day as tolerated (except for heavy lifting as described below). You can climb stairs.  Avoid lifting anything over 10 lbs for 2 weeks and over 20 lbs for 6 weeks  Perform light housework and cooking as tolerated  Drive after 72 hours  Exercise after 5 to 10 days  No tub baths or swimming until incisions are completely healed  Shower as tolerated  allowing the puncture sites to air dry  Nothing in the vagina until cleared at your follow up visit. No tampons or vaginal intercourse. Douching is NOT recommended.  Do not strain or bear down for bowel movements  Take over-the-counter pain medications such as Tylenol, Motrin, Advil, Aleve regularly (every 6 to 8 hours). If these are not sufficient, take your prescription pain medication as needed.  Resume medications you were taking before surgery unless otherwise advised by your surgeon    Incision Care:   Laparoscopy incisions are typically very small and rarely have complications.   You may wash the incisions with soapy water, then thoroughly dry the area and keep the skin dry. Dressings or band-aids are not necessary as exposure to oxygen in the air speeds healing.   You may notice one or more stitches; these will dissolve over time, usually four weeks after the surgery.   Itching, bruising, a pulling sensation, and/or numbness around the incisions are all normal.   If any incision becomes hot, red, swollen or increasingly painful, please call your physician.   If you have Steri-Strips (like Band-Aids) or Dermabond (skin glue) on your wound they will stay in place for 1-2 weeks. You may trim any edges that peel off as needed.    Vaginal  Bleeding:   You may have slight vaginal bleeding for 3 to 5 days after surgery. The bleeding should not be heavier than a normal period.   Nothing should be put into your vagina for at least 6-8 weeks. Do not use tampons (use pads only), douche or have sexual intercourse. You may shower, but do not soak in tub baths, use a jacuzzi or go swimming.   If you had a supracervical hysterectomy, occasionally a small amount of bleeding can occur at the time of your period and this may be annoying, but is normal.    If you were prescribed narcotic pain medications (e.g., oxycodone):  No drinking alcohol, driving, or operating heavy machinery if taking narcotic pain medications  You  should NOT drive until you can safely apply the brakes without abdominal pain    How to Prevent and Treat Constipation:  You may have trouble with bowel movements after you go home  Drink plenty of liquids. Avoid caffeine drinks as they may dehydrate you  Being up and walking around is helpful, as well  Narcotic pain pills will cause constipation. Take a stool softener (docusate sodium or Colace 100 mg) twice a day while on narcotics. You can buy this without a prescription at any drugstore.  If you have no bowel movement within 4 days after leaving the hospital, follow these instructions:  Use rectal suppositories (like Dulcolax) or Fleets enemas as long as NO resection of your bowel was done during surgery. You should have a bowel movement within 4-6 hours.  If you had a resection of your bowel during surgery, you can use Miralax powder 17g up to 3 times a day or 2-4 tablespoonfuls of milk of magnesia (magnesium citrate)  You can buy all of these without a prescription at any drugstore

## 2022-05-07 NOTE — Anesthesia Postprocedure Evaluation (Signed)
Anesthesia Post Note    Patient: Elizabeth White [name]    Procedure(s) Performed: Procedure(s):  T2 OBS Pelvic examination under anesthesia, diagnostic laparoscopy, right salpingo-oophorectomy; left salpingectomy  X      Final anesthesia type: General    Patient location: PACU    Post anesthesia pain: adequate analgesia    Mental status: awake, alert  and oriented    Airway Patent: Yes    Last Vitals:    Vitals Value Taken Time   BP 124/69 05/07/22 0930   Temp 36.5 C 05/07/22 0930   Pulse 84 05/07/22 0930   Resp 16 05/07/22 0930   SpO2 100 % 05/07/22 0930        Temperature >35.5: Yes    Post vital signs: stable    Hydration: adequate    N/V:no    Plan of care per primary team.

## 2022-05-07 NOTE — Op Note (Signed)
Brief Operative Note    DATE: 05/07/2022  TIME: 9:32 AM    PREOPERATIVE DIAGNOSIS: R adnexal mass    POSTOPERATIVE DIAGNOSIS: same, s/p procedure as below    PROCEDURE INFORMATION:  Procedure(s):  T2 OBS Pelvic examination under anesthesia, diagnostic laparoscopy, right salpingo-oophorectomy; left salpingectomy - Wound Class: Class II (Clean Contaminated) - Incision Closure: Deep and Superficial Layers  X - Wound Class: Class II (Clean Contaminated) - Incision Closure: Superficial Layers Only    ATTENDING SURGEON:   Surgeon(s) and Role:     Wilfred Curtis, Gelene Mink, MD - Primary     * Cathie Beams, MD - Resident - Assisting     * Cornelius-Schecter, Vicente Males, MD - Resident - Assisting    ANESTHESIA: GETA    FINDINGS: see full operative report    SPECIMENS: R ovary, R adnexal mass, R fallopian tube, L fallopian tube, pelvic washings    Fluids/Blood Products:      IV Fluids: 1100cc    Blood Products: none    EBL: 10cc    Urine Output: 676HM    COMPLICATIONS: none    DISPOSITION: stable to PACU    Procedure in Detail: see full operative report    This procedure was completed with Dr. Wilfred Curtis (A) who was present for all key portions of the case.     Anna Cornelius-Schecter  Obstetrics & Gynecology, PGY-1    For questions or concerns, please contact the service pager [phone] prior to paging the physician directly for an immediate response.

## 2022-05-07 NOTE — Event / Update (Signed)
Brief Gynecology Oncology Note    MD to bedside to evaluate patient for dizziness in the immediate post-op period.    Subjective  Patient reports that she continues to feel dizzy. Feels like "the room is spinning." Has a history of vertigo but has not had an episode in "years." No longer endorsing nausea, feels better. Denies fever, chills, body aches, shortness of breath, or chest pain. Feels some discomfort with urination but is able to void without issue. Denies abdominal pain. Feeling overall well aside from dizziness.    Objective   05/07/22  1500 05/07/22  1600 05/07/22  1700 05/07/22  1800   BP: (!) 108/56 119/72 115/82 118/69   Pulse: 93 85 85 77   Temp:       Resp: 22 30 (!) 34 15   SpO2: 100% 100% 98% 100%     Gen: appears comfortable, lying in bed, eyes closed, no acute distress  Cardiac: regular rate and rhythm, normal S1/S2. No murmurs noted.  Resp: breathing appears unlabored, clear to auscultation bilaterally  Abd: soft, non-tender, non-distended. 3 laparoscopic incision sites with overlying tegaderm bandages in place without evidence of strikethrough or surrounding erythema.  Ext: no edema or tenderness to palpation noted in the lower extremities bilaterally.    WBC 7.8 (06/20) HGB 12.8 (06/20) PLT 187 (06/20)    HCT 37.9 (06/20)      Na 135* (06/20) CL 102 (06/20) BUN 9 (06/20) GLU   120 (06/20)   K 4.1 (06/20) CO2 24 (06/20) Cr 0.6 (06/20)      Assessment & Plan    # Dizziness  Patient has a history of vertigo, endorsing dizziness in the immediate post-operative period but is otherwise asymptomatic. Suspect secondary to anesthetic medications. Low suspicion for intraabdominal bleeding given Hgb within normal limits post-op and no signs of acute abdomen on physical exam. Vital signs within normal limits.  - meclizine PRN  - zofran PRN  - continue to monitor symptoms, if dizziness not improved, admit for observation overnight. If improved and patient can safely ambulate without issue, patient safe  for discharge given vital signs and labs within normal limits.     Wayne Both, MD, PGY-1  Obstetrics and Gynecology

## 2022-05-07 NOTE — Event / Update (Signed)
Brief Note    MD to bedside for pt report of dizziness, 1 episode of N/V. Dizziness with movement side to side, sitting up. Declines antiemetic medications for symptomatic management at this time. Currently eating saltines without N/V. Nausea worse with attempts to move. Denies visual changes, chest pain, shortness of breath, subjective palpitations. States she feels the urge to urinate but not ambulating yet due to dizziness and has not attempted to void.      05/07/22  1330 05/07/22  1400 05/07/22  1500 05/07/22  1600   BP: 113/66 126/62 (!) 108/56 119/72   Pulse: 88 71 93 85   Temp:       Resp: '29 22 22 30   '$ SpO2: 100% 99% 100% 100%     Physical Exam  General: alert, oriented, no distress  HEENT: NC/AT  Cardiovascular: regular rate and rhythm, no murmurs, rubs or gallops  Lungs: normal work of breathing, CTAB  Abdomen: soft, non-distended, mild suprapubic tenderness, appropriately tender, no rebound or guarding    A/P:  - CBC, BMP  - bedside commode to facilitate easier voiding    Legrand Como, MD  [facility], PGY-1

## 2022-05-07 NOTE — H&P (Signed)
[place]  Pre-Operative History    Elizabeth White  05/07/22     CC: 7cm complex right adnexal mass    History of Present Illness: **obtained from most recent office visit on 03/19/2022 and updated 05/07/22 with any new information**    Interview conducted in: English    Oncologic History:   03/27/2021 CT A/P w/o Con: A fat containing right adnexal lesion measuring 5.4 x 5.5 x 2.4 cm most consistent with dermoid cyst. The uterus is retroverted with thickening of the endometrial canal.  05/2021: [place]-125 24  10/05/2021 - TVUS: Retroverted 9.7 cm fibroid uterus with a R adnexal complex echogenic solid components with low level echoes 6.7 x 3.6 x 6.3 cm with no color flow seen. Two left simplex ovarian cysts 1.4 and 1.5 cm. Endometrium 1.4 cm. No pelvic fluid.  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 Nonvisualization of the left ovary. No other abnormality is noted within the pelvis.    Obstetrical History:  OB History   Gravida Para Term Preterm AB Living   '3 3 3     3   '$ SAB IAB Ectopic Multiple Live Births           3      # Outcome Date GA Lbr Len/2nd Weight Sex Delivery Anes PTL Lv   3 Term            2 Term            1 Term               Obstetric Comments   SVD x3        Gynecologic History:  Gyn History     LMP: Postmenopausal    Age at Menarche: 68    Age at First Pregnancy: 40    Age at Menopause:     Gyn History Comments:     Sexual Activity: No sexual activity data on record; No partner data on record    Contraception: No contraception data on record          Past Medical History:  Patient  Active Problem List   Diagnosis   . Adnexal mass   . Abnormal uterine bleeding       Past Surgical History:  Past Surgical History:   Procedure Laterality Date   . TUBAL LIGATION Bilateral 1998       Family History:   Family History   Problem Relation Name Age of Onset   . Colorectal Cancer Sister         Social History:   Social History     Socioeconomic History   . Marital status: Divorced   Tobacco Use   . Smoking status: Never   . Smokeless tobacco: Never   Substance and Sexual Activity   . Alcohol use: Never   . Drug use: Never       Medications:   Current Facility-Administered Medications   Medication   . acetaminophen (TYLENOL) tablet 500 mg   . plasmalyte-A infusion       Allergies:   No Known Allergies  Review of Systems:  14-point ROS was performed.  Pertinent positives and negatives as in HPI.    Objective:  A full physical exam was performed at the patient's preoperative visit and will be re-peeformed on the day of surgery     Labs:  BMP:  Na   CL   BUN   GLU       K   CO2   Cr           CBC:  WBC   HGB   PLT      HCT        Coags:  PT   PTT     INR           Imaging:  No results found.    Pathology:  FINAL DIAGNOSIS AFTER MICROSCOPY:     ENDOMETRIUM, BIOPSY:    -   Mucous and few benign weakly proliferative endometrium.   -   Negative for hyperplasia or malignancy.     Assessment:  Elizabeth White is a 56 year old [id number] with a history of a 7cm right complex adnexal mass and PMB, presenting planned procedure as below.     Plan:   # Consents signed, pre-op orders placed  # Preop Labs:COVID,CBC,BMP, T&S, bHCG on DOS, Ca125, CEA, Ca199  # KFM:[id number]  # CHE:[id number]  # Preoperative Imaging:CT abd/pelvis  # Medical optimization and risk stratification:notindicated  # Cardiac optimization and risk stratification:notindicated    PROCEDURE:Pelvic examination under anesthesia, diagnostic laparoscopy, right versus left salpingo-oophorectomy    POSSIBLE:exploratory  laparotomy, bilateral salpingo-oophorectomy, dilation and curettage     Consent and questions were answered during the patient's preoperative appointment.     Patient was seen be the attending physician during the preoperative appointment and will be seen again on day of surgery.     For questions or concerns, please contact the Gynecologic Oncology pager [phone]. DO NOT page the physician directly for an immediate response as this will likely delay a response given the pager is covered by multiple providers.

## 2022-05-07 NOTE — Discharge Summary (Signed)
Gynecologic Oncology DISCHARGE SUMMARY                      OVERVIEW:     Admit Date: 05/07/2022  Discharge Date: 05/08/2022  Admitting physician: Roland Earl, MD  Discharge physician: Roland Earl, MD  Primary Care Provider: Nguyen, Trinity STAY:     REASON FOR HOSPITALIZATION:  56 year old [id number] with history of a 7cm right complex adnexal mass and PMB admitted for the planned procedure below. Please see the admission H&P for full details regarding the patient's admission.     PROCEDURES PERFORMED:   Procedure(s):  T2 OBS Pelvic examination under anesthesia, diagnostic laparoscopy, right salpingo-oophorectomy; left salpingectomy - Wound Class: Class II (Clean Contaminated) - Incision Closure: Deep and Superficial Layers  X - Wound Class: Class II (Clean Contaminated) - Incision Closure: Superficial Layers Only    HOSPITAL COURSE/SIGNIFICANT FINDINGS:   Routine postoperative care was performed without evident postoperative complications. Pain control was achieved with IV medications and transitioned to an oral regimen. She was ambulatory and had SCDs for DVT prophylaxis. Foley catheter was removed on POD#0 and she voided normally.    Hospital course was complicated by dizziness and an episode of emesis in PACU, likely secondary to anesthetic medications, patient was given meclizine and admitted for observation.    She was tolerating a regular diet, voiding spontaneously, passing flatus, and ambulating. Her incision was without evidence of infection at the time of discharge.  Vital signs were stable and exam was benign.  She was discharged to home in stable condition under full self-care.    The patient was counseled on the importance of following up in regards to her appointments and additional aspects of her healthcare as listed below. All questions were answered and the patient was seen by our service's attending prior to discharge. Please see Discharge documentation in full for  appointment times/dates and additional care needed after discharge.    FINAL DIAGNOSIS/PROBLEM LIST:   Patient Active Problem List    Diagnosis Date Noted   . Pelvic mass 05/08/2022   . Abnormal uterine bleeding 04/30/2022   . Adnexal mass 03/20/2022     Added automatically from request for surgery [id number]          Consultations During Admission:  None     DISCHARGE PLAN:     DISCHARGED TO:    Home    Rattan:  Final surgical pathology    CONDITION AT DISCHARGE:  Stable    DIET AT DISCHARGE:  Regular    REQUESTED SERVICES:   None    PRESCRIPTIONS:       What To Do With Your Medications      START taking these medications      Add'l Info   acetaminophen 325 MG tablet  Commonly known as: TYLENOL  Take 2 tablets (650 mg) by mouth every 6 hours as needed for Mild Pain (Pain Score 1-3).   Quantity: 60 tablet  Refills: 0     ibuprofen 600 MG tablet  Commonly known as: MOTRIN  Take 1 tablet (600 mg) by mouth every 6 hours as needed for Mild Pain (Pain Score 1-3) or Moderate Pain (Pain Score 4-6).   Quantity: 60 tablet  Refills: 0     oxyCODONE 5 MG immediate release tablet  Commonly known as: ROXICODONE  Take 1 tablet (5 mg) by mouth every 6 hours as  needed for Severe Pain (Pain Score 7-10).   Quantity: 8 tablet  Refills: 0     senna 8.6 MG tablet  Commonly known as: SENOKOT  Take 1 tablet (8.6 mg) by mouth daily.   Quantity: 30 tablet  Refills: 0        CONTINUE taking these medications      Add'l Info   chlorhexidine 0.12 % solution  Commonly known as: PERIDEX  RINSE TWICE DAILY BY MOUTH FOR 1 MINUTE   Refills: 0     methylPREDNISolone 4 MG tablet  Commonly known as: MEDROL DOSEPACK  Take as directed on package   Quantity: 1 each  Refills: 0           Where to Get Your Medications      These medications [facility] sent [id number] [address] [place] [address]   Phone: [phone]   acetaminophen  325 MG tablet   ibuprofen 600 MG tablet   oxyCODONE 5 MG immediate release tablet   senna 8.6 MG tablet         FOLLOW-UP:   Future Appointments   Date Time Provider DMcClure  05/23/2022  2:20 PM CRoland Earl MD [id number]   06/18/2022  2:20 PM [facility]      Discharging physician's contact number: [phone]    The plan was discussed with the patient and her family. The opportunity was had to ask questions and have questions answered to apparent satisfaction. The plan was agreed upon as stated.     CJanett Billow MD  [facility], PGY-1

## 2022-05-08 DIAGNOSIS — R1900 Intra-abdominal and pelvic swelling, mass and lump, unspecified site: Secondary | ICD-10-CM | POA: Diagnosis present

## 2022-05-08 LAB — BASIC METABOLIC PANEL, BLOOD
BUN: 9 mg/dL (ref 7–25)
CO2: 26 mmol/L (ref 21–31)
Calcium: 8.7 mg/dL (ref 8.6–10.3)
Chloride: 107 mmol/L (ref 98–107)
Creat: 0.6 mg/dL (ref 0.6–1.2)
Electrolyte Balance: 5 mmol/L (ref 2–12)
Glucose: 81 mg/dL — ABNORMAL LOW (ref 85–125)
Potassium: 3.7 mmol/L (ref 3.5–5.1)
Sodium: 138 mmol/L (ref 136–145)
eGFR - high estimate: >60 (ref >59)
eGFR - low estimate: >60 (ref >59)

## 2022-05-08 LAB — HEMOGRAM, BLOOD
Hematocrit: 37.5 % (ref 34.0–44.0)
Hgb: 12.7 G/DL (ref 11.5–15.0)
MCH: 30.2 PG (ref 27.0–33.5)
MCHC: 34 G/DL (ref 32.0–35.5)
MCV: 88.8 FL (ref 81.5–97.0)
MPV: 7.1 FL — ABNORMAL LOW (ref 7.2–11.7)
PLT Count: 186 10*3/uL (ref 150–400)
RBC: 4.22 10*6/uL (ref 3.70–5.00)
RDW-CV: 12.6 % (ref 11.6–14.4)
White Bld Cell Count: 10.2 10*3/uL (ref 4.0–10.5)

## 2022-05-08 NOTE — Plan of Care (Signed)
Problem: Promotion of health and safety  Goal: Promotion of Health and Safety  Description: The patient remains safe, receives appropriate treatment and achieves optimal outcomes (physically, psychosocially, and spiritually) within the limitations of the disease process by discharge.  05/08/2022 0512 by Meridee Score, RN  Outcome: Progressing  Flowsheets  Taken 05/08/2022 0512  Standard of Care/Policy:   Medical/Surgical   Pain   Skin   Falls Reduction  Taken 05/08/2022 0252  Outcome Evaluation (rationale for progressing/not progressing) every shift: Pt is A&OX4. VSS. Denies pain. Abdomen dressing is clean, dry and intact.  Individualized Interventions/Recommendations (Discharge Readiness): Pending AM rounding. Tolerates diet.  Individualized Interventions/Recommendations (Skin/Comfort/Safety/Mobility): Pt is self turning in bed. Bed in lowest position. CAll light within reach. Safety measures in placed.  Individualized Interventions/Recommendations (Knowledge): Pt updated on her poc.  Individualized Interventions/Recommendations: Manage pt's dizziness.  Taken 05/08/2022 0200  Patient /Family stated Goal: rest  05/08/2022 0252 by Meridee Score, RN  Flowsheets  Taken 05/08/2022 [phone]  Outcome Evaluation (rationale for progressing/not progressing) every shift: Pt is A&OX4. VSS. Denies pain. Abdomen dressing is clean, dry and intact.  Individualized Interventions/Recommendations (Discharge Readiness): Pending AM rounding. Tolerates diet.  Individualized Interventions/Recommendations (Skin/Comfort/Safety/Mobility): Pt is self turning in bed. Bed in lowest position. CAll light within reach. Safety measures in placed.  Individualized Interventions/Recommendations (Knowledge): Pt updated on her poc.  Individualized Interventions/Recommendations: Manage pt's dizziness.  Taken 05/08/2022 0200  Patient /Family stated Goal: rest   Nursing Shift Summary    Provider Notification for the past 12 hrs:   Provider Name  Reason for Communication Provider Response   05/08/22 0205 [id number] Wondering y pt is still on NPO, if not can pt hav a diet ordered? Plz also discont pt's d/c orders. thanks --   05/08/22 0230 [id number] Wondering y pt is still on NPO, if not can pt hav a diet ordered? Plz also discont pt's d/c orders. thanks MD ordered a diet for pt and discont the d/c order.       Overall Mobility/Safe Patient Handling  Progressive Mobility Level: 4  BMAT Score/Safe Patient Handling: BMAT 3 - non powered stand aid and/or ambulation aid  Assistive Device: None  Turn in Bed: Independent       Shift Comments for the past 12 hrs:   Comments Observations   05/08/22 0107 -- Paged [phone] for obs order   05/08/22 0510 Pt is A&OX4. VSS. Denies pain. Tolerates diet. Abdomen dressing is clean, dry and intact. Pt able to void via bedpan. Pt self turns in bed. Bed in lowest position. Call lilght within reach. Will endorse poc to next shift/ --

## 2022-05-08 NOTE — Progress Notes (Signed)
Required Daily Device Assessment:  This patient does not have a documented central line or urinary catheter.    Gynecologic Oncology Progress Note  Elizabeth White  05/08/22  Hospital Days:   0 days - Admitted on: 05/07/2022; Admitted on: 05/07/2022     SUBJECTIVE:  No acute events overnight.  Pain well-controlled with PO medications. Ambulating well on the floor, tolerating regular diet, voiding without complications. + flatus, - BM.  Denies heavy vaginal bleeding, fevers, chills, vertigo, chest pain, shortness of breath, nausea, vomiting.    CURRENT VITALS:  Temperature:  [96.4 F (35.8 C)-99.1 F (37.3 C)] 96.4 F (35.8 C) (06/21 0331)  Blood pressure (BP): (108-126)/(56-82) 118/70 (06/21 0331)  Heart Rate:  [64-93] 70 (06/21 0331)  Respirations:  [12-34] 16 (06/21 0331)  Pain Score: Patient Sleeping, Respiratory Assessment Done (06/21 0331)  O2 Device: None (Room air) (06/21 0331)  O2 Flow Rate (L/min):  [6 l/min] 6 l/min (06/20 0930)  SpO2:  [94 %-100 %] 98 % (06/21 0331)    PHYSICAL EXAM:  Gen: NAD, AOx4  CV: regular rate and rhythm  Pulm: breathing comfortably, clear to auscultation bilaterally  Abd: soft, non-tender, non-distended, no guarding, no rebound, c/d/i incisions without evidence of erythema, induration, or inappropriate discharge  Extremities: no edema or cyanosis, nontender, SCDs in place  Psych: conversing appropriately, normal affect    I&Os:    Intake/Output Summary (Last 24 hours) at 05/08/2022 0539  Last data filed at 05/08/2022 0207  Gross per 24 hour   Intake --   Output 1395 ml   Net -1395 ml       LABS:  WBC 7.8 (06/20) HGB 12.8 (06/20) PLT 187 (06/20)    HCT 37.9 (06/20)        Na 135* (06/20) CL 102 (06/20) BUN 9 (06/20) GLU   120 (06/20)   K 4.1 (06/20) CO2 24 (06/20) Cr 0.6 (06/20)        TP   AST   TBILI   ALK PHOS      ALB   ALT   DBILI          [place] 7.9* (06/20) Mg   Phos        IMAGING:  None new    CURRENT MEDICATIONS:    . plasmalyte-A 10 mL/hr (05/07/22 2778)     .  meclizine  12.5 mg Q8H PRN   . ondansetron  4 mg Q6H PRN       ASSESSMENT & PLAN:  Elizabeth White is a 56 year old female with a history of a 7cm right complex adnexal mass and PMB s/p pelvic examination under anesthesia, diagnostic laparoscopy, right salpingo-oophorectomy; left salpingectomy    PMH: none  PSH: b/l Tubal ligation 1998    Edited byAngelyn Punt, Nilda Simmer at 05/07/2022 1409     # POD #1: Overall doing well. Continue routine postoperative care.   - advance diet as tolerated  - pain controlled with PO meds  - encourage ambulation  - foley out 6/20  _0  follow up AM CBC  - drains: N/A  - pain regimen: tylenol  - UOP 1.97 cc/kg/hr  - EBL 10 mL  _1  follow up final surgical pathology    Dispo: anticipate home today    Seen with Dr. Essie Christine (F).    For questions or concerns, please contact the Gynecologic Oncology pager [phone]. DO NOT page the physician directly for an immediate response as this will likely delay  a response given the pager is covered by multiple providers.      Athena Masse, MS4  [place] [facility]    Janett Billow, MD  [facility], PGY-1

## 2022-05-09 LAB — PATHOLOGY TISSUE EXAM - ~~LOC~~

## 2022-05-09 LAB — CYTOLOGY NON GYN - ~~LOC~~

## 2022-05-23 ENCOUNTER — Ambulatory Visit: Payer: No Typology Code available for payment source

## 2022-05-23 VITALS — BP 99/63 | HR 75 | Temp 97.5°F | Resp 16 | Wt 103.4 lb

## 2022-05-23 DIAGNOSIS — R1900 Intra-abdominal and pelvic swelling, mass and lump, unspecified site: Secondary | ICD-10-CM

## 2022-05-23 NOTE — Progress Notes (Signed)
[facility]   Ambulatory Gyn Onc Follow Up Note    Elizabeth White  Thursday May 23, 2022    CC: Right ovarian serous cystadenoma, left paratubal cyst    Reason for Visit: Post-op #1    HPI: Elizabeth White is a 56 year old G3P3 with a history of right ovarian serous cystadenoma and left paratubal cyst, s/p lap RSO, LS on 05/07/2022, presenting for first post-op visit. Please see below for full details of her oncology history.    Patient was last seen in clinic 03/19/2022, at which time plan was for EUA, laparoscopic right and/or left salpingo-oophorectomy; possible: laparotomy, dilation and curettage; repeat CT and tumor markers; RTC in 2 weeks for telemedicine to review imaging prior to surgery,     On today's visit, patient is doing overall well, states she has experienced fatigue since discharge on 05/08/2022. States that she used to be able to go up and down stairs without trouble but it now fatigued after a couple flights of stairs. Her strength is not diminished. She is sleeping 8-9 hours nightly and PO intake has returned to baseline. She also developed an itchy rash on her arms and legs two weeks ago with no known trigger. She visited her PCP's office on 05/20/2022 for the rash and was prescribed hydroxyzine HCl and triamcinolone 1% cream. No fevers, chills, dysuria, hematuria, constipation, diarrhea, nausea, vomiting, abdominal pain, SOB or chest pain.     Oncologic History:    03/27/2021 CT A/P w/o Con: A fat containing right adnexal lesion measuring 5.4 x 5.5 x 2.4 cm most consistent with dermoid cyst. The uterus is retroverted with thickening of the endometrial canal.  10/05/2021 - TVUS: Retroverted 9.7 cm fibroid uterus with a R adnexal complex echogenic solid components with low level echoes 6.7 x 3.6 x 6.3 cm with no color flow seen. Two left simplex ovarian cysts 1.4 and 1.5 cm. Endometrium 1.4 cm. No pelvic fluid.  12/11/2021 - CTAP: R adnexal fatty mass  compatible with dermoid Tumor   12/21/2021 - TVUS: probably benign uterine fibroid in upper body. 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 EMS 0.6 cm  12/28/2021 - EMB: negative for hyperplasia or malignancy  01/23/2022 - MRI Pelvis: There is a fatty mass within the right adnexal region with a small soft tissue component noted above, likely to represent a dermoid tumor. Nonvisualization of the left ovary. No other abnormality is noted within the pelvis.  05/07/2022 - pEUA, laparoscopic RSO, LS: Path demonstrates left paratubal cyst, right ovarian serous cystadenoma. Cytology demonstrates No malignant cells seen. Histiocytes and inflammatory cells present.    Labs:  [facility]-125  Lab Results   Component Value Date/Time    CA125 26 04/03/2022 01:03 PM    CA125 25 01/02/2022 09:38 AM   05/2021: 24 (OSH)    [place] 19-9  Lab Results   Component Value Date/Time    CA199 <3 04/03/2022 01:03 PM    CA199 <3 01/02/2022 09:38 AM     CEA  Lab Results   Component Value Date/Time    CEA <2.0 04/03/2022 01:03 PM    CEA <2.0 01/02/2022 09:38 AM     Imaging:  12/21/21: TVUS  probably benign uterine fibroid in upper body. 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 EMS 0.6 cm    01/23/2022: MRI Pelvis  Findings:  The uterus is retroverted. There are no uterine masses. The endometrial lining appears  unremarkable. The cervix and vagina appear grossly unremarkable. The left ovary is difficult to localize with certainty. A normal right ovary is not localized with certainty. There is however a right adnexal fatty mass posteriorly which is elongated and lobulated measuring 3.0 x 4.4 x 5.4 cm. There is a small intraluminal soft tissue components measuring overall 2.0 x 1.1 x 1.5 cm. There is probably enhancement of the solid component with IV contrast.  There is no presacral mass. There is a normal appearance to the bladder. No abnormal masses are readily appreciated in the pelvis. Marrow signal within the  bony structures is unremarkable. There are no bony masses.    Conclusions:  1. There is a fatty mass within the right adnexal region with a small soft tissue component noted above, likely to represent a dermoid tumor.  2. Nonvisualization of the left ovary. No other abnormality is noted within the pelvis.    Pathology:  12/28/2021 - EMB:  Final Diagnosis ENDOMETRIUM, BIOPSY:    -   Mucous and few benign weakly proliferative endometrium.   -   Negative for hyperplasia or malignancy.      05/07/2022 - Cytology:  Collected: 05/07/2022 08:27 AM  Received: 05/07/2022 02:44 PM  Final Diagnosis   A. Pelvic, Pelvic Washings:  SPECIMEN ADEQUACY:  Satisfactory for evaluation.  DIAGNOSTIC INTERPRETATION:  No malignant cells seen.  Histiocytes and inflammatory cells present.     05/07/2022 - Pathology:  Collected: 05/07/2022 08:42 AM  Received: 05/07/2022 11:26 AM  Final Diagnosis   A.         FALLOPIAN TUBE, LEFT, LEFT SALPINGECTOMY:  -           Paratubal cyst  -           Fallopian tube, no significant pathologic findings    B.         RIGHT FALLOPIAN TUBE AND OVARY, LAPAROSCOPIC RIGHT SALPINGO-OOPHORECTOMY:  RIGHT OVARY:  -           Serous cystadenoma                RIGHT FALLOPIAN TUBE:              -           No significant pathologic findings     Review of Systems:  Pertinent positives and negatives are noted in HPI.    Physical Exam:  Vital signs: Blood pressure (BP): 99/63  Heart Rate: 75  Temperature: 97.5 F (36.4 C)  Respirations: 16     Weight: (!) 46.9 kg (103 lb 6.3 oz)  General: alert, oriented, no distress  CV: regular rate and rhythm. Normal S1/S2.   Abdomen: soft, nontender, nondistended. 3 healing incision wounds (RLQ, umbilical area, and LLQ; 2-3 cm each) with no tenderness, erythema or discharge.   Integumentary: Scattered, nonblanchable, pruritic, erythematous papules (1-26m each) on bilateral lower legs and arms. No other new skin lesions.   Ext: no peripheral edema  Pelvic:  deferred    Assessment: Elizabeth Lindstromis a 56year old G3P3 with a history of right ovarian serous cystadenoma and left paratubal cyst, s/p lap RSO, LS on 05/07/2022, presenting for first post-op visit. She was well-appearing on examination with only complaints being fatigue and skin rash. Fatigue appears appropriate for post-operative recovery period with no SOB, chest pain, or other associated symptoms. Pt is seeing PCP for rash care. Incision sites are healing well with no erythema, tenderness, or discharge. Pathology results of  samples taken during 05/07/2022 procedure are benign. Vitals are within normal limits.      Plan:   #Post-Operative Recovery  - Pt is recovering well with no acute concerns  - Advised refraining from heavy lifting or strenuous physical activity until 6 weeks post-surgery  - Reassured pt that fatigue is likely secondary to post-operation recovery but advised seeking immediate medical attention for chest pain or shortness of breath     RTC 1 month for post-operative follow-up     Written by: Lexine Baton, [place] [facility]     Patient was seen, examined, and counseled by Dr. Wilfred Curtis.     Note prepared by medical scribe, Elizabeth White.

## 2022-05-23 NOTE — Progress Notes (Signed)
Gyn Oncology Attending Note  56yo P3 with complex adnexal mass s/p laparoscopy RSO, left salpingectomy (05/07/22) here for post-op visit.  Patient doing well, denies any severe pain, nausea/vomiting, ambulating and voiding.  On exam, laparoscopic incisions well healing.  Reviewed path with patient and her daughter today.   Plan: RTC in 4 weeks  Gypsy Balsam, MD  Attending, Gynecologic Oncology    A.         FALLOPIAN TUBE, LEFT, LEFT SALPINGECTOMY:  -           Paratubal cyst  -           Fallopian tube, no significant pathologic findings    B.         RIGHT FALLOPIAN TUBE AND OVARY, LAPAROSCOPIC RIGHT SALPINGO-OOPHORECTOMY:  RIGHT OVARY:  -           Serous cystadenoma                RIGHT FALLOPIAN TUBE:              -           No significant pathologic findings

## 2022-05-24 NOTE — Addendum Note (Signed)
Addended by: Laurian Brim on: 05/24/2022 10:03 AM     Modules accepted: Orders

## 2022-06-02 ENCOUNTER — Emergency Department
Admission: EM | Admit: 2022-06-02 | Discharge: 2022-06-02 | Discharge status: Against Medical Advice | Payer: No Typology Code available for payment source

## 2022-06-02 MED ORDER — HYDROXYZINE HCL 25 MG OR TABS: 25.0000 mg | ORAL_TABLET | Freq: Three times a day (TID) | ORAL | Status: AC | PRN

## 2022-06-02 MED ORDER — TRIAMCINOLONE ACETONIDE 0.1 % EX CREA
1.0000 | TOPICAL_CREAM | Freq: Two times a day (BID) | CUTANEOUS | Status: DC
Start: ? — End: 2022-09-03

## 2022-06-02 NOTE — ED Notes (Signed)
Pt DNA in [place]

## 2022-06-02 NOTE — ED Notes (Signed)
Pt DNA in [place]

## 2022-06-02 NOTE — ED Notes (Signed)
Pt DNA in [place]

## 2022-06-18 ENCOUNTER — Ambulatory Visit: Payer: No Typology Code available for payment source

## 2022-06-18 VITALS — BP 128/74 | HR 75 | Temp 97.5°F | Resp 16 | Wt 103.5 lb

## 2022-06-18 DIAGNOSIS — D279 Benign neoplasm of unspecified ovary: Secondary | ICD-10-CM | POA: Insufficient documentation

## 2022-06-18 DIAGNOSIS — N838 Other noninflammatory disorders of ovary, fallopian tube and broad ligament: Secondary | ICD-10-CM

## 2022-06-18 NOTE — Progress Notes (Incomplete)
[facility]   Ambulatory Gyn Onc Follow Up Note    Melaine Mcphee  Tuesday June 18, 2022    CC: Right ovarian serous cystadenoma, left paratubal cyst    Reason for Visit: Post-op visit #2    HPI: Elizabeth White is a 56 year old G3P3 with a history of right ovarian serous cystadenoma and left paratubal cyst, s/p lap RSO, LS on 05/07/2022 presenting for psot-op visit #2.  Please see below for full details of her oncology history.    Patient was last seen in clinic on 05/23/2022, at which time stated she had experienced fatigue since discharge on 05/08/2022. Stated that she used to be able to go up and down stairs without trouble but it now fatigued after a couple flights of stairs. Her strength was not diminished. She was sleeping 8-9 hours nightly and PO intake had returned to baseline. She also developed an itchy rash on her arms and legs two weeks ago with no known trigger. She visited her PCP's office on 05/20/2022 for the rash and was prescribed hydroxyzine HCl and triamcinolone 1% cream. She was advised to refrain from heavy lifting or strenuous physical activity until 6 weeks post-surgery and she was reassured that fatigue is likely secondary to post-operation recovery but advised seeking immediate medical attention for chest pain or shortness of breath. The plan was to RTC in 1 month for post-operative follow-up.    On today's visit, patient is doing overall well, states ***    Oncologic History:    03/27/2021 CT A/P w/o Con: A fat containing right adnexal lesion measuring 5.4 x 5.5 x 2.4 cm most consistent with dermoid cyst. The uterus is retroverted with thickening of the endometrial canal.  10/05/2021 - TVUS: Retroverted 9.7 cm fibroid uterus with a R adnexal complex echogenic solid components with low level echoes 6.7 x 3.6 x 6.3 cm with no color flow seen. Two left simplex ovarian cysts 1.4 and 1.5 cm. Endometrium 1.4 cm. No pelvic fluid.  12/11/2021 -  CTAP: R adnexal fatty mass compatible with dermoid Tumor   12/21/2021 - TVUS: probably benign uterine fibroid in upper body. 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 EMS 0.6 cm  12/28/2021 - EMB: negative for hyperplasia or malignancy  01/23/2022 - MRI Pelvis: There is a fatty mass within the right adnexal region with a small soft tissue component noted above, likely to represent a dermoid tumor. Nonvisualization of the left ovary. No other abnormality is noted within the pelvis.  05/07/2022 - pEUA, laparoscopic RSO, LS: Path demonstrates left paratubal cyst, right ovarian serous cystadenoma. Cytology demonstrates No malignant cells seen. Histiocytes and inflammatory cells present.    Labs:  [place]-125  Lab Results   Component Value Date/Time    CA125 26 04/03/2022 01:03 PM    CA125 25 01/02/2022 09:38 AM     [place] 19-9  Lab Results   Component Value Date/Time    CA199 <3 04/03/2022 01:03 PM    CA199 <3 01/02/2022 09:38 AM     CEA  Lab Results   Component Value Date/Time    CEA <2.0 04/03/2022 01:03 PM    CEA <2.0 01/02/2022 09:38 AM     Imaging:  12/21/21: TVUS  probably benign uterine fibroid in upper body. 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 EMS 0.6 cm    01/23/2022: MRI Pelvis  Findings:  The uterus is retroverted. There are no uterine  masses. The endometrial lining appears unremarkable. The cervix and vagina appear grossly unremarkable. The left ovary is difficult to localize with certainty. A normal right ovary is not localized with certainty. There is however a right adnexal fatty mass posteriorly which is elongated and lobulated measuring 3.0 x 4.4 x 5.4 cm. There is a small intraluminal soft tissue components measuring overall 2.0 x 1.1 x 1.5 cm. There is probably enhancement of the solid component with IV contrast.  There is no presacral mass. There is a normal appearance to the bladder. No abnormal masses are readily appreciated in the pelvis. Marrow signal  within the bony structures is unremarkable. There are no bony masses.    Conclusions:  1. There is a fatty mass within the right adnexal region with a small soft tissue component noted above, likely to represent a dermoid tumor.  2. Nonvisualization of the left ovary. No other abnormality is noted within the pelvis.    Pathology:  12/28/2021 - EMB:  Final Diagnosis ENDOMETRIUM, BIOPSY:    -   Mucous and few benign weakly proliferative endometrium.   -   Negative for hyperplasia or malignancy.      05/07/2022 - Cytology:  Collected: 05/07/2022 08:27 AM  Received: 05/07/2022 02:44 PM  Final Diagnosis   A. Pelvic, Pelvic Washings:  SPECIMEN ADEQUACY:  Satisfactory for evaluation.  DIAGNOSTIC INTERPRETATION:  No malignant cells seen.  Histiocytes and inflammatory cells present.     05/07/2022 - Pathology:  Collected: 05/07/2022 08:42 AM  Received: 05/07/2022 11:26 AM  Final Diagnosis   A. FALLOPIAN TUBE, LEFT, LEFT SALPINGECTOMY:  -Paratubal cyst  -Fallopian tube, no significant pathologic findings    B. RIGHT FALLOPIAN TUBE AND OVARY, LAPAROSCOPIC RIGHT SALPINGO-OOPHORECTOMY:  RIGHT OVARY:  -Serous cystadenoma    RIGHT FALLOPIAN TUBE:  -No significant pathologic findings         Review of Systems:  Pertinent positives and negatives are noted in HPI.    Physical Exam:  Vital signs:                   General: alert, oriented, no distress  Abdomen: soft, nontender, nondistended  Ext: no peripheral edema  Pelvic: *** normal female external genitalia, smooth vaginal walls, no adnexal masses, normal rectovaginal septum and anal tone    Exam performed with chaperone *** in the room.    Assessment: Elizabeth White is a 56 year old G3P3 with a history of right ovarian serous cystadenoma and left paratubal cyst, s/p lap RSO, LS on 05/07/2022 presenting for psot-op visit #2.      Plan:   - ***  - RTC ***    Patient was  seen, examined, and counseled by Dr. Wilfred Curtis.     Note prepared by medical scribe, Patsy Lager.       Medical Student: ***, MS***

## 2022-06-18 NOTE — Progress Notes (Signed)
[facility]   Ambulatory Gyn Onc Follow Up Note    Elizabeth White  Tuesday June 18, 2022    CC: Right ovarian serous cystadenoma, left paratubal cyst    Reason for Visit: Post-op visit #2    HPI: Elizabeth White is a 56 year old G3P3 with a history of right ovarian serous cystadenoma and left paratubal cyst, s/p lap RSO, LS on 05/07/2022 presenting for psot-op visit #2.  Please see below for full details of her oncology history.    Patient was last seen in clinic on 05/23/2022, at which time stated she had experienced fatigue since discharge on 05/08/2022. Stated that she used to be able to go up and down stairs without trouble but it now fatigued after a couple flights of stairs. Her strength was not diminished. She was sleeping 8-9 hours nightly and PO intake had returned to baseline. She also developed an itchy rash on her arms and legs two weeks ago with no known trigger. She visited her PCP's office on 05/20/2022 for the rash and was prescribed hydroxyzine HCl and triamcinolone 1% cream. She was advised to refrain from heavy lifting or strenuous physical activity until 6 weeks post-surgery and she was reassured that fatigue is likely secondary to post-operation recovery but advised seeking immediate medical attention for chest pain or shortness of breath. The plan was to RTC in 1 month for post-operative follow-up.    On today's visit, patient is doing overall well. States that she just sometimes feels a slight pinch where her incision site is when she turns while driving. Currently wondering if she can return to work now [place] to [employer], systems > 30lbs. Denies chest pain, SOB, abdominal or pelvic pain, vaginal bleeding or abnormal discharge, nausea/vomiting, fever/chills.     Oncologic History:    03/27/2021 CT A/P w/o Con: A fat containing right adnexal lesion measuring 5.4 x 5.5 x 2.4 cm most consistent with dermoid cyst. The  uterus is retroverted with thickening of the endometrial canal.  10/05/2021 - TVUS: Retroverted 9.7 cm fibroid uterus with a R adnexal complex echogenic solid components with low level echoes 6.7 x 3.6 x 6.3 cm with no color flow seen. Two left simplex ovarian cysts 1.4 and 1.5 cm. Endometrium 1.4 cm. No pelvic fluid.  12/11/2021 - CTAP: R adnexal fatty mass compatible with dermoid Tumor   12/21/2021 - TVUS: probably benign uterine fibroid in upper body. 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 EMS 0.6 cm  12/28/2021 - EMB: negative for hyperplasia or malignancy  01/23/2022 - MRI Pelvis: There is a fatty mass within the right adnexal region with a small soft tissue component noted above, likely to represent a dermoid tumor. Nonvisualization of the left ovary. No other abnormality is noted within the pelvis.  05/07/2022 - pEUA, laparoscopic RSO, LS: Path demonstrates left paratubal cyst, right ovarian serous cystadenoma. Cytology demonstrates No malignant cells seen. Histiocytes and inflammatory cells present.    Labs:  [place]-125  Lab Results   Component Value Date/Time    CA125 26 04/03/2022 01:03 PM    CA125 25 01/02/2022 09:38 AM     [place] 19-9  Lab Results   Component Value Date/Time    CA199 <3 04/03/2022 01:03 PM    CA199 <3 01/02/2022 09:38 AM     CEA  Lab Results   Component Value Date/Time    CEA <2.0 04/03/2022  01:03 PM    CEA <2.0 01/02/2022 09:38 AM     Imaging:  12/21/21: TVUS  probably benign uterine fibroid in upper body. 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 EMS 0.6 cm     01/23/2022: MRI Pelvis  Findings:  The uterus is retroverted. There are no uterine masses. The endometrial lining appears unremarkable. The cervix and vagina appear grossly unremarkable. The left ovary is difficult to localize with certainty. A normal right ovary is not localized with certainty. There is however a right adnexal fatty mass posteriorly which is elongated and lobulated  measuring 3.0 x 4.4 x 5.4 cm. There is a small intraluminal soft tissue components measuring overall 2.0 x 1.1 x 1.5 cm. There is probably enhancement of the solid component with IV contrast.  There is no presacral mass. There is a normal appearance to the bladder. No abnormal masses are readily appreciated in the pelvis. Marrow signal within the bony structures is unremarkable. There are no bony masses.     Conclusions:  1. There is a fatty mass within the right adnexal region with a small soft tissue component noted above, likely to represent a dermoid tumor.  2. Nonvisualization of the left ovary. No other abnormality is noted within the pelvis.     Pathology:  12/28/2021 - EMB:  Final Diagnosis ENDOMETRIUM, BIOPSY:     -     Mucous and few benign weakly proliferative endometrium.   -     Negative for hyperplasia or malignancy.       05/07/2022 - Cytology:  Collected: 05/07/2022 08:27 AM  Received: 05/07/2022 02:44 PM  Final Diagnosis   A. Pelvic, Pelvic Washings:  SPECIMEN ADEQUACY:  Satisfactory for evaluation.  DIAGNOSTIC INTERPRETATION:  No malignant cells seen.  Histiocytes and inflammatory cells present.      05/07/2022 - Pathology:  Collected: 05/07/2022 08:42 AM  Received: 05/07/2022 11:26 AM  Final Diagnosis   A.         FALLOPIAN TUBE, LEFT, LEFT SALPINGECTOMY:  -           Paratubal cyst  -           Fallopian tube, no significant pathologic findings     B.         RIGHT FALLOPIAN TUBE AND OVARY, LAPAROSCOPIC RIGHT SALPINGO-OOPHORECTOMY:  RIGHT OVARY:  -           Serous cystadenoma                 RIGHT FALLOPIAN TUBE:              -           No significant pathologic findings         Review of Systems:  Pertinent positives and negatives are noted in HPI.    Physical Exam:  Vital signs: Blood pressure (BP): 128/74  Heart Rate: 75  Temperature: 97.5 F (36.4 C)  Respirations: 16     Weight: (!) 47 kg (103 lb 8.1 oz)  General: alert, oriented, no distress  Abdomen: soft, nontender,  nondistended  Ext: no peripheral edema  Pelvic: deferred    Assessment: Elizabeth White is a 56 year old G3P3 with a history of right ovarian serous cystadenoma and left paratubal cyst, s/p lap RSO, LS on 05/07/2022 presenting for psot-op visit #2.      Plan:   - Can return to work now; 2 weeks no heavy lifting >15lbs  -  Referral to Gynecology  - RTC PRN    Patient was seen, examined, and counseled by Dr. Wilfred Curtis.     Note prepared by medical scribe, Patsy Lager.     Henrietta Hoover, [place] [facility]

## 2022-06-19 ENCOUNTER — Telehealth: Payer: Self-pay

## 2022-06-19 NOTE — Telephone Encounter (Signed)
Pt requesting to speak to RN regarding the date on the letter that was given to her yesterday about going back to work. Attempted to call RN Vania Rea. Per pt if does not answer please call daughter since you cannot leave voicemail on her phone.    Please assist

## 2022-06-19 NOTE — Telephone Encounter (Signed)
Spoke with pt about return to work note. Pt thought the lifting restriction was for 6 weeks. Explained that MD said 2 weeks which aligns with MD note. Advised pt to call back in mid-August if not feeling ready to lift or having pain with lifting to discuss with MD. Pt verbalized understanding.

## 2022-06-24 NOTE — Progress Notes (Signed)
Gyn Oncology Attending Note  56yo P3 with complex adnexal mass s/p laparoscopy RSO, left salpingectomy (05/07/22) here for post-op visit.  Patient doing well, denies any severe pain, nausea/vomiting, ambulating and voiding.  On exam, laparoscopic incisions well healing.  Reviewed benign path.  [place] [employer] return to work with heavy lifting restrictions x 4 weeks.  Plan: RTC as needed, refer to Gyn to establish care  All questions answered  Gypsy Balsam, MD  Attending, Gynecologic Oncology     A.         FALLOPIAN TUBE, LEFT, LEFT SALPINGECTOMY:  -           Paratubal cyst  -           Fallopian tube, no significant pathologic findings     B.         RIGHT FALLOPIAN TUBE AND OVARY, LAPAROSCOPIC RIGHT SALPINGO-OOPHORECTOMY:  RIGHT OVARY:  -           Serous cystadenoma                 RIGHT FALLOPIAN TUBE:              -           No significant pathologic findings

## 2022-09-03 ENCOUNTER — Encounter: Payer: Self-pay | Admitting: Ob/Gyn

## 2022-09-03 ENCOUNTER — Ambulatory Visit: Payer: No Typology Code available for payment source | Admitting: Ob/Gyn

## 2022-09-03 VITALS — BP 126/82 | HR 70 | Temp 98.0°F | Resp 18 | Ht 60.0 in | Wt 99.0 lb

## 2022-09-03 DIAGNOSIS — Z23 Encounter for immunization: Secondary | ICD-10-CM | POA: Insufficient documentation

## 2022-09-03 DIAGNOSIS — Z681 Body mass index (BMI) 19 or less, adult: Secondary | ICD-10-CM | POA: Insufficient documentation

## 2022-09-03 NOTE — Progress Notes (Signed)
Gynecology: New Outpatient Visit    Reason for Visit:   Chief Complaint   Patient presents with   . Gyn Exam       Chief Complaint/HPI:   Elizabeth White Elizabeth White is a 56 year old G3P3003 with PMH of ovarian cystadenoma s/p laparoscopic RSO and L salpingectomy 05/07/22 presenting for   Chief Complaint   Patient presents with   . Gyn Exam     PCP: Dr. Hillery Aldo     05/07/22 S/p laparoscopic RSO and left salpingectomy w Gyn Onc Dr. Wilfred Curtis for complex adnexal mass with benign path of R ovarian serous cystadenoma and L paratubal cyst. Reports that she is doing well, has returned back to her normal function. No longer requires any f/u with Gyn Onc.     Hx of intermittent, pruritus on her extremities and abdomen since 2015 but recently worsened after surgery. Most recently, notes new red rash on her L mid-abdomen. No improvements w Triamcinolone cream, Hydroxyzine, Methylprednisolone injection in ED on 06/02/22. Unable to identify triggers. Per pt, allergen test was positive for dog, but has had pet dogs her whole life and new dog since 2021.       Obstetrical History:  OB History   Gravida Para Term Preterm AB Living   '3 3 3     3   '$ SAB IAB Ectopic Multiple Live Births           3      # Outcome Date GA Lbr Len/2nd Weight Sex Delivery Anes PTL Lv   3 Term            2 Term            1 Term               Obstetric Comments   53 term SVD w/ no complications   6773 term SVD w/ no complications   7366 term SVD w/ no complications      Gyn Hx   LMP: 02/2022 Postmenopausal, previous regular, last 3-4 days, denies dysmenorrhea or heavy bleeding    Pap: 2022, denies abnl (no report in system)   Mammogram: 07/10/22 normal, denies abnl (no report in system)   Colonscopy: negative FIT 2022   STI:  Denies hx   Contraception: none today        Gynecologic History:  Gyn History       LMP: Postmenopausal    Age at Menarche: 64    Age at First Pregnancy: 67    Age at Menopause:     Gyn History Comments:     Sexual Activity: Not  Currently; No partner data on record; no current birth control methods    Contraception: No contraception data on record            No LMP recorded. Patient is postmenopausal.     Past Medical History:  Past Medical History:   Diagnosis Date   . Anemia    . Ovarian mass, right     ovarian cystadenoma        Past Surgical History:  Past Surgical History:   Procedure Laterality Date   . LAPAROSCOPY Bilateral 2023    RSO and Left salpingectomy   . TUBAL LIGATION Bilateral 1998       Family History:   Family History   Problem Relation Name Age of Onset   . Colorectal Cancer Sister     . Ovarian Cancer Neg Hx     .  Endometrial Cancer Neg Hx     . Breast Cancer Neg Hx         Social History:   Social History     Socioeconomic History   . Marital status: Divorced   Tobacco Use   . Smoking status: Never   . Smokeless tobacco: Never   Substance and Sexual Activity   . Alcohol use: Never   . Drug use: Never   . Sexual activity: Not Currently     Comment: no current birth control methods       Medications:     Current Outpatient Medications:   .  acetaminophen (TYLENOL) 325 MG tablet, Take 2 tablets (650 mg) by mouth every 6 hours as needed for Mild Pain (Pain Score 1-3)., Disp: 60 tablet, Rfl: 0  .  hydrOXYzine HCL (ATARAX) 25 MG tablet, Take 1 tablet (25 mg) by mouth 3 times daily as needed for Itching., Disp: , Rfl:   .  ibuprofen (MOTRIN) 600 MG tablet, Take 1 tablet (600 mg) by mouth every 6 hours as needed for Mild Pain (Pain Score 1-3) or Moderate Pain (Pain Score 4-6)., Disp: 60 tablet, Rfl: 0  .  senna (SENOKOT) 8.6 MG tablet, Take 1 tablet (8.6 mg) by mouth daily., Disp: 30 tablet, Rfl: 0  .  triamcinolone (KENALOG) 0.1 % cream, Apply 1 Application. topically 2 times daily. Apply a thin layer as directed, Disp: , Rfl:      Non currently    Allergies:   No Known Allergies    ROS:   Per HPI. 14 point ROS otherwise negative.    Physical Exam:   BP 126/82 (BP Location: Left arm, BP Patient Position: Sitting, BP cuff  size: Regular)   Pulse 70   Temp 98 F (36.7 C) (Temporal)   Resp 18   Ht 5' (1.524 m)   Wt (!) 44.9 kg (98 lb 15.8 oz)   BMI 19.33 kg/m     General: no acute distress, AxO  HEENT: NC/AT  Cardio: regular rate and rhythm.  No murmurs, rubs or gallops.  Lungs: clear to auscultation bilaterally.  No wheezes, rhonchi or rales.  Breast: symmetric bilaterally. No masses appreciated. No skin changes or dimpling. No nipple discharge or LAD.  Abdomen: soft, non-distended, non-tender, no rebound or guarding  Pelvic:  normal appearing external genitalia.   No cervical motion tenderness.  Uterus is small, mobile, and non-tender.  No adnexal masses or tenderness.  Extremities: no clubbing, cyanosis, edema. No erythema, cording or calf tenderness.    Labs:  None    Imaging:  None    Assessment/Plan:  Elizabeth White is a 56 year old G3P3003 with PMH of ovarian cystadenoma s/p laparoscopic RSO and L salpingectomy 05/07/22  presenting for   Chief Complaint   Patient presents with   . Gyn Exam       # health maintenance  - cervical cancer screening: Pap smear 06/2021, denies abnl -- records requested  - breast cancer screening: 06/2022, denies abnl but request records  - STI screening: declines  - contraception: Postmenopausal  - HPV/COVID/other vaccines: Flu vaccine today  - recommend patient to f/u with Family Medicine for: general health maintenance     #Abdominal rash and pruritus  - Recommend follow up with PCP     Return to clinic in 1 year for WWE, or PRN symptoms.    The patient understands the above assessment and plan as outlined above. All questions were answered at  bedside with the patient.      Patient seen and discussed with Dr. Philmore Pali (A).     Tessie Eke, [place] [facility]      Note finalized by:     Newman Nip, MD  09/03/22  12:35 PM  Obstetrics & Gynecology PGY-1

## 2022-09-05 NOTE — Progress Notes (Signed)
Attending Note:  I evaluated the patient concurrently with the Resident/Fellow.  I discussed the case with the Resident/Fellow and agree with the findings and plan as documented by the Resident/Fellow.  Any additions or revisions are included in the record as necessary.       Paola Flynt G Joaquim Tolen, MD       Subjective:  I reviewed the history.  History of present illness (HPI):  Gyn Exam     Review of Systems (ROS): As per the resident's note.  Past Medical, Family, Social History:  As per the resident's  note.    Objective:     Assessment and plan reviewed with the resident physician.  I agree with the resident's plan as documented.    See the resident's note for further details.

## 2022-10-01 ENCOUNTER — Telehealth: Payer: Self-pay

## 2022-10-01 NOTE — Telephone Encounter (Signed)
Patient came into office and dropped off her outside Lab result to be reviewed by a provider.

## 2022-10-01 NOTE — Telephone Encounter (Signed)
Pap results uploaded to media and routed to Dr. Trenton Founds for review. Maurice Small
